# Patient Record
Sex: Male | Born: 1960 | Race: White | Hispanic: No | Marital: Single | Smoking: Never smoker
Health system: Southern US, Community
[De-identification: ages and names within clinical notes are randomized; demographics above are authoritative.]

## PROBLEM LIST (undated history)

## (undated) DIAGNOSIS — E785 Hyperlipidemia, unspecified: Secondary | ICD-10-CM

## (undated) DIAGNOSIS — N201 Calculus of ureter: Secondary | ICD-10-CM

## (undated) DIAGNOSIS — Z87828 Personal history of other (healed) physical injury and trauma: Secondary | ICD-10-CM

## (undated) DIAGNOSIS — M1990 Unspecified osteoarthritis, unspecified site: Secondary | ICD-10-CM

## (undated) DIAGNOSIS — M5030 Other cervical disc degeneration, unspecified cervical region: Secondary | ICD-10-CM

## (undated) DIAGNOSIS — R42 Dizziness and giddiness: Secondary | ICD-10-CM

## (undated) DIAGNOSIS — I10 Essential (primary) hypertension: Secondary | ICD-10-CM

## (undated) DIAGNOSIS — Z9189 Other specified personal risk factors, not elsewhere classified: Secondary | ICD-10-CM

## (undated) DIAGNOSIS — K5730 Diverticulosis of large intestine without perforation or abscess without bleeding: Secondary | ICD-10-CM

## (undated) DIAGNOSIS — Z8739 Personal history of other diseases of the musculoskeletal system and connective tissue: Secondary | ICD-10-CM

## (undated) DIAGNOSIS — H269 Unspecified cataract: Secondary | ICD-10-CM

## (undated) DIAGNOSIS — K219 Gastro-esophageal reflux disease without esophagitis: Secondary | ICD-10-CM

## (undated) DIAGNOSIS — Z973 Presence of spectacles and contact lenses: Secondary | ICD-10-CM

## (undated) DIAGNOSIS — E119 Type 2 diabetes mellitus without complications: Secondary | ICD-10-CM

## (undated) DIAGNOSIS — N200 Calculus of kidney: Secondary | ICD-10-CM

## (undated) HISTORY — PX: POLYPECTOMY: SHX149

## (undated) HISTORY — DX: Dizziness and giddiness: R42

## (undated) HISTORY — DX: Unspecified cataract: H26.9

---

## 1997-08-28 HISTORY — PX: CRANIOTOMY: SHX93

## 2004-08-26 ENCOUNTER — Ambulatory Visit: Payer: Self-pay | Admitting: Internal Medicine

## 2004-10-07 ENCOUNTER — Ambulatory Visit: Payer: Self-pay | Admitting: Family Medicine

## 2006-10-04 ENCOUNTER — Ambulatory Visit: Payer: Self-pay | Admitting: Internal Medicine

## 2006-11-02 ENCOUNTER — Ambulatory Visit: Payer: Self-pay | Admitting: Family Medicine

## 2007-03-11 ENCOUNTER — Ambulatory Visit: Payer: Self-pay | Admitting: Internal Medicine

## 2007-03-11 DIAGNOSIS — Z9189 Other specified personal risk factors, not elsewhere classified: Secondary | ICD-10-CM | POA: Insufficient documentation

## 2008-06-26 ENCOUNTER — Telehealth: Payer: Self-pay | Admitting: Internal Medicine

## 2008-07-13 ENCOUNTER — Telehealth: Payer: Self-pay | Admitting: Family Medicine

## 2008-08-04 ENCOUNTER — Ambulatory Visit: Payer: Self-pay | Admitting: Gastroenterology

## 2008-08-18 ENCOUNTER — Ambulatory Visit: Payer: Self-pay | Admitting: Gastroenterology

## 2008-08-18 HISTORY — PX: COLONOSCOPY W/ POLYPECTOMY: SHX1380

## 2008-08-23 ENCOUNTER — Encounter: Payer: Self-pay | Admitting: Gastroenterology

## 2008-10-16 ENCOUNTER — Telehealth: Payer: Self-pay | Admitting: Family Medicine

## 2008-11-24 ENCOUNTER — Ambulatory Visit: Payer: Self-pay | Admitting: Family Medicine

## 2008-11-24 DIAGNOSIS — M109 Gout, unspecified: Secondary | ICD-10-CM | POA: Insufficient documentation

## 2008-12-18 LAB — CONVERTED CEMR LAB
ALT: 21 units/L (ref 0–53)
AST: 16 units/L (ref 0–37)
Alkaline Phosphatase: 81 units/L (ref 39–117)
CO2: 30 meq/L (ref 19–32)
Calcium: 9.3 mg/dL (ref 8.4–10.5)
Eosinophils Absolute: 0.1 10*3/uL (ref 0.0–0.7)
Eosinophils Relative: 2 % (ref 0.0–5.0)
Glucose, Bld: 122 mg/dL — ABNORMAL HIGH (ref 70–99)
HCT: 42.9 % (ref 39.0–52.0)
Hemoglobin: 14.8 g/dL (ref 13.0–17.0)
LDL Cholesterol: 91 mg/dL (ref 0–99)
Lymphs Abs: 2.1 10*3/uL (ref 0.7–4.0)
MCHC: 34.6 g/dL (ref 30.0–36.0)
Monocytes Relative: 8.6 % (ref 3.0–12.0)
Neutro Abs: 4.3 10*3/uL (ref 1.4–7.7)
Potassium: 4.3 meq/L (ref 3.5–5.1)
RBC: 4.66 M/uL (ref 4.22–5.81)
RDW: 11.9 % (ref 11.5–14.6)
Sodium: 143 meq/L (ref 135–145)
TSH: 0.96 microintl units/mL (ref 0.35–5.50)
Total CHOL/HDL Ratio: 5
Uric Acid, Serum: 7 mg/dL (ref 4.0–7.8)
VLDL: 10 mg/dL (ref 0.0–40.0)

## 2009-09-16 ENCOUNTER — Ambulatory Visit: Payer: Self-pay | Admitting: Family Medicine

## 2009-09-16 DIAGNOSIS — M546 Pain in thoracic spine: Secondary | ICD-10-CM

## 2010-04-11 ENCOUNTER — Telehealth: Payer: Self-pay | Admitting: Family Medicine

## 2010-09-27 NOTE — Progress Notes (Signed)
Summary: REFILL REQUEST  Phone Note Refill Request Message from:  Patient on April 11, 2010 9:04 AM  Refills Requested: Medication #1:  DICLOFENAC SODIUM 50 MG TBEC three times a day as needed pain.   Notes: [facility] on [address].    Initial call taken by: Debbra Riding,  April 11, 2010 9:05 AM    Prescriptions: DICLOFENAC SODIUM 50 MG TBEC (DICLOFENAC SODIUM) three times a day as needed pain  #60 x 5   Entered by:   Raechel Ache, RN   Authorized by:   Nelwyn Salisbury MD   Signed by:   Raechel Ache, RN on 04/11/2010   Method used:   Electronically to        [facility] (retail)       [address]       [place], [place]  [address]       Ph: [phone]       Fax: [phone]   RxID:   [id number]
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
## 2010-09-27 NOTE — Progress Notes (Signed)
Summary: new rx needed  Phone Note Call from Patient Call back at [phone]   Caller: Patient---live call Summary of Call: Need new rx for Indomethacin 50mg  three times a day as needed for gout. call [facility] on [address]. pt stated that he has been on this rx and it has ran out.  Initial call taken by: Warnell Forester,  April 11, 2010 12:16 PM  Follow-up for Phone Call        call in #60 with 5 rf Follow-up by: Nelwyn Salisbury MD,  April 11, 2010 1:12 PM    New/Updated Medications: INDOMETHACIN 50 MG CAPS (INDOMETHACIN) 1 three times a day as needed gout Prescriptions: INDOMETHACIN 50 MG CAPS (INDOMETHACIN) 1 three times a day as needed gout  #60 x 5   Entered by:   Raechel Ache, RN   Authorized by:   Nelwyn Salisbury MD   Signed by:   Raechel Ache, RN on 04/11/2010   Method used:   Electronically to        [facility] (retail)       [address]       [place], [place]  [address]       Ph: [phone]       Fax: [phone]   RxID:   [id number]

## 2010-11-17 ENCOUNTER — Other Ambulatory Visit: Payer: Self-pay | Admitting: Family Medicine

## 2010-11-18 ENCOUNTER — Other Ambulatory Visit: Payer: Self-pay | Admitting: Family Medicine

## 2011-02-15 ENCOUNTER — Telehealth: Payer: Self-pay | Admitting: *Deleted

## 2011-02-15 NOTE — Telephone Encounter (Signed)
Pt needs to be worked in for cpx, had biometrics done at work and was advised A1C was 10.1, needs cpx labs and cpx, next available cpx is 03/29/11.  Please advise if pt can be worked in sooner.

## 2011-02-16 NOTE — Telephone Encounter (Signed)
Actually we need to address these abnormal labs before we do a cpx. A cpx is for health maintenance, not addressing acute problems. Set him up to see me ASAP for a morning appt. Have him come fasting, and have him bring in these results to go over

## 2011-02-17 NOTE — Telephone Encounter (Signed)
Asked pt to return call about abnormal labs and making an appointment.

## 2011-02-21 NOTE — Telephone Encounter (Signed)
Left message to call back  

## 2011-02-24 NOTE — Telephone Encounter (Signed)
Left message on machine for patient  regarding Dr Claris Che note

## 2011-02-24 NOTE — Telephone Encounter (Signed)
Left message to call back  

## 2011-03-09 ENCOUNTER — Encounter: Payer: Self-pay | Admitting: Family Medicine

## 2011-03-15 ENCOUNTER — Ambulatory Visit (INDEPENDENT_AMBULATORY_CARE_PROVIDER_SITE_OTHER): Payer: Managed Care, Other (non HMO) | Admitting: Family Medicine

## 2011-03-15 ENCOUNTER — Encounter: Payer: Self-pay | Admitting: Family Medicine

## 2011-03-15 VITALS — BP 132/86 | HR 108 | Temp 98.6°F | Wt 272.0 lb

## 2011-03-15 DIAGNOSIS — E119 Type 2 diabetes mellitus without complications: Secondary | ICD-10-CM

## 2011-03-15 LAB — BASIC METABOLIC PANEL
BUN: 16 mg/dL (ref 6–23)
Calcium: 9.2 mg/dL (ref 8.4–10.5)
Creatinine, Ser: 0.9 mg/dL (ref 0.4–1.5)
GFR: 94.7 mL/min (ref >60.00)
Glucose, Bld: 275 mg/dL — ABNORMAL HIGH (ref 70–99)
Potassium: 4.3 mEq/L (ref 3.5–5.1)

## 2011-03-15 LAB — CBC WITH DIFFERENTIAL/PLATELET
Basophils Relative: 0.6 % (ref 0.0–3.0)
Eosinophils Relative: 2.3 % (ref 0.0–5.0)
Lymphocytes Relative: 27.2 % (ref 12.0–46.0)
Monocytes Relative: 6 % (ref 3.0–12.0)
Neutrophils Relative %: 63.9 % (ref 43.0–77.0)
RBC: 5.11 Mil/uL (ref 4.22–5.81)
WBC: 8.3 10*3/uL (ref 4.5–10.5)

## 2011-03-15 LAB — POCT URINALYSIS DIPSTICK
Bilirubin, UA: NEGATIVE
Leukocytes, UA: NEGATIVE
Nitrite, UA: NEGATIVE

## 2011-03-15 LAB — HEPATIC FUNCTION PANEL
Alkaline Phosphatase: 90 U/L (ref 39–117)
Total Bilirubin: 1.1 mg/dL (ref 0.3–1.2)
Total Protein: 7.6 g/dL (ref 6.0–8.3)

## 2011-03-15 LAB — MICROALBUMIN / CREATININE URINE RATIO
Creatinine,U: 186.3 mg/dL
Microalb Creat Ratio: 0.7 mg/g (ref 0.0–30.0)
Microalb, Ur: 1.3 mg/dL (ref 0.0–1.9)

## 2011-03-15 NOTE — Progress Notes (Signed)
  Subjective:    Patient ID: Randy Waters, male    DOB: 1960/11/18, 50 y.o.   MRN: [id number]  HPI Here to discuss recent abnormal labs. When we last checked his fasting glucose here in 2010 it was a bit high at 122. However as part of a free health screening at his job on 10-20-10 he had a fasting glucose of 249 and an A1c of 10.3. Since then he has not changed his diet at all, but he is exercising several days a week. He feels fine. He has a strong family hx of diabetes.    Review of Systems  Constitutional: Negative.   Respiratory: Negative.   Cardiovascular: Negative.        Objective:   Physical Exam  Constitutional:       Obese   Cardiovascular: Normal rate, regular rhythm, normal heart sounds and intact distal pulses.   Pulmonary/Chest: Effort normal and breath sounds normal.          Assessment & Plan:  New onset type 2 diabetes. We discussed this at length. He will begin a serious weight reduction plan and limit his carbohydrate intake. He will limit his daily calorie intake to 1800. Get fasting labs today. Depending on these results we may start him on Metformin.

## 2011-03-17 ENCOUNTER — Telehealth: Payer: Self-pay

## 2011-03-17 NOTE — Telephone Encounter (Signed)
Message copied by Beverely Low on Fri Mar 17, 2011  3:22 PM ------      Message from: Gershon Crane A      Created: Thu Mar 16, 2011  1:37 PM       Normal except his diabetes is very poorly controlled. Call in Metformin 1000 mg bid , #60 with 11 rf. See me in one month

## 2011-03-17 NOTE — Telephone Encounter (Signed)
LMTCB

## 2011-03-20 ENCOUNTER — Telehealth: Payer: Self-pay | Admitting: Family Medicine

## 2011-03-20 NOTE — Telephone Encounter (Signed)
I called in script in and pt aware.

## 2011-04-20 ENCOUNTER — Encounter: Payer: Self-pay | Admitting: Family Medicine

## 2011-04-20 ENCOUNTER — Ambulatory Visit (INDEPENDENT_AMBULATORY_CARE_PROVIDER_SITE_OTHER): Payer: Managed Care, Other (non HMO) | Admitting: Family Medicine

## 2011-04-20 VITALS — BP 136/90 | HR 98 | Temp 98.8°F | Wt 264.0 lb

## 2011-04-20 DIAGNOSIS — E1129 Type 2 diabetes mellitus with other diabetic kidney complication: Secondary | ICD-10-CM | POA: Insufficient documentation

## 2011-04-20 DIAGNOSIS — E119 Type 2 diabetes mellitus without complications: Secondary | ICD-10-CM

## 2011-04-20 LAB — GLUCOSE, POCT (MANUAL RESULT ENTRY): POC Glucose: 100

## 2011-04-20 LAB — HEMOGLOBIN A1C: Hgb A1c MFr Bld: 8.6 % — ABNORMAL HIGH (ref 4.6–6.5)

## 2011-04-20 NOTE — Progress Notes (Signed)
  Subjective:    Patient ID: Randy Waters, male    DOB: 24-Oct-1960, 50 y.o.   MRN: [id number]  HPI Here to follow up newly diagnosed type 2 diabetes. He was here one month ago when his A1c was 12.4. We discussed diet and exercise, and he was started on Metformin. He has been doing well, and he has lost about 10 lbs.    Review of Systems  Constitutional: Negative.   Respiratory: Negative.   Cardiovascular: Negative.        Objective:   Physical Exam  Constitutional: He appears well-developed and well-nourished.  Cardiovascular: Normal rate, regular rhythm, normal heart sounds and intact distal pulses.   Pulmonary/Chest: Effort normal and breath sounds normal.          Assessment & Plan:  He seems to be making tremendous strides in getting his diabetes under control. Get another A1c today

## 2011-04-21 ENCOUNTER — Telehealth: Payer: Self-pay | Admitting: Family Medicine

## 2011-04-21 NOTE — Telephone Encounter (Signed)
Spoke with pt and put lab order in computer.

## 2011-04-21 NOTE — Telephone Encounter (Signed)
Message copied by Baldemar Friday on Fri Apr 21, 2011  4:05 PM ------      Message from: Gershon Crane A      Created: Fri Apr 21, 2011  1:10 PM       Diabetes is better but we still have a ways to go. Continue current dose of Metformin and diet. Recheck an A1c in 90 days

## 2011-04-21 NOTE — Telephone Encounter (Signed)
Pt aware of future lab order.

## 2011-07-24 ENCOUNTER — Other Ambulatory Visit (INDEPENDENT_AMBULATORY_CARE_PROVIDER_SITE_OTHER): Payer: Managed Care, Other (non HMO)

## 2011-07-24 DIAGNOSIS — E119 Type 2 diabetes mellitus without complications: Secondary | ICD-10-CM

## 2011-07-27 NOTE — Progress Notes (Signed)
Quick Note:  Spoke with pt ______ 

## 2012-03-21 ENCOUNTER — Other Ambulatory Visit: Payer: Self-pay | Admitting: Family Medicine

## 2012-04-22 ENCOUNTER — Encounter: Payer: Self-pay | Admitting: Family Medicine

## 2012-04-22 ENCOUNTER — Ambulatory Visit (INDEPENDENT_AMBULATORY_CARE_PROVIDER_SITE_OTHER): Payer: Managed Care, Other (non HMO) | Admitting: Family Medicine

## 2012-04-22 VITALS — BP 130/84 | HR 87 | Temp 98.4°F | Wt 226.0 lb

## 2012-04-22 DIAGNOSIS — E119 Type 2 diabetes mellitus without complications: Secondary | ICD-10-CM

## 2012-04-22 DIAGNOSIS — M109 Gout, unspecified: Secondary | ICD-10-CM

## 2012-04-22 DIAGNOSIS — M546 Pain in thoracic spine: Secondary | ICD-10-CM

## 2012-04-22 LAB — BASIC METABOLIC PANEL WITH GFR
BUN: 20 mg/dL (ref 6–23)
CO2: 29 meq/L (ref 19–32)
Calcium: 9.3 mg/dL (ref 8.4–10.5)
Chloride: 106 meq/L (ref 96–112)
Creatinine, Ser: 1 mg/dL (ref 0.4–1.5)
GFR: 84.47 mL/min
Glucose, Bld: 93 mg/dL (ref 70–99)
Potassium: 4.1 meq/L (ref 3.5–5.1)
Sodium: 141 meq/L (ref 135–145)

## 2012-04-22 LAB — HEMOGLOBIN A1C: Hgb A1c MFr Bld: 5.1 % (ref 4.6–6.5)

## 2012-04-22 LAB — MICROALBUMIN / CREATININE URINE RATIO: Microalb, Ur: 1.3 mg/dL (ref 0.0–1.9)

## 2012-04-22 MED ORDER — CYCLOBENZAPRINE HCL 10 MG PO TABS: 10.0000 mg | ORAL_TABLET | Freq: Three times a day (TID) | ORAL | Status: DC | PRN

## 2012-04-22 MED ORDER — INDOMETHACIN 50 MG PO CAPS: 50.0000 mg | ORAL_CAPSULE | Freq: Three times a day (TID) | ORAL | Status: DC | PRN

## 2012-04-22 MED ORDER — METFORMIN HCL 500 MG PO TABS: 500.0000 mg | ORAL_TABLET | Freq: Two times a day (BID) | ORAL | Status: DC

## 2012-04-22 NOTE — Progress Notes (Signed)
  Subjective:    Patient ID: Randy Waters, male    DOB: 02-23-1961, 51 y.o.   MRN: [id number]  HPI Here to follow up. He feels great and he has lost almost 40 lbs in the past year. He is eating a much healthier diet. He thinks his glucoses drop at times because he gets shaky and gets HAs which go away when he eats. He does not check his sugars.    Review of Systems  Constitutional: Negative.   Respiratory: Negative.   Cardiovascular: Negative.        Objective:   Physical Exam  Constitutional: He appears well-developed and well-nourished.  Neck: No thyromegaly present.  Cardiovascular: Normal rate, regular rhythm, normal heart sounds and intact distal pulses.   Pulmonary/Chest: Effort normal and breath sounds normal.  Lymphadenopathy:    He has no cervical adenopathy.          Assessment & Plan:  We will decrease the Metformin to 500 mg bid. Get labs today

## 2012-08-16 ENCOUNTER — Telehealth: Payer: Self-pay | Admitting: Family Medicine

## 2012-08-16 NOTE — Telephone Encounter (Signed)
Patient called stating that he has no more refills on his metformin 500mg  1po bid and need it sent to [facility] on [address]. Please assist.

## 2012-08-19 MED ORDER — METFORMIN HCL 500 MG PO TABS: 500.0000 mg | ORAL_TABLET | Freq: Two times a day (BID) | ORAL | Status: DC

## 2012-09-02 ENCOUNTER — Ambulatory Visit (INDEPENDENT_AMBULATORY_CARE_PROVIDER_SITE_OTHER): Payer: Managed Care, Other (non HMO) | Admitting: Family Medicine

## 2012-09-02 ENCOUNTER — Encounter: Payer: Self-pay | Admitting: Family Medicine

## 2012-09-02 VITALS — BP 144/90 | HR 101 | Temp 98.2°F | Wt 248.0 lb

## 2012-09-02 DIAGNOSIS — E119 Type 2 diabetes mellitus without complications: Secondary | ICD-10-CM

## 2012-09-02 NOTE — Progress Notes (Signed)
  Subjective:    Patient ID: Randy Waters, male    DOB: 1961-06-17, 52 y.o.   MRN: [id number]  HPI Here to follow up on diabetes. He feels great and has no concerns. He ran out of Metformin 2 weeks ago.    Review of Systems  Constitutional: Negative.   Respiratory: Negative.   Cardiovascular: Negative.        Objective:   Physical Exam  Constitutional: He appears well-developed and well-nourished.  Cardiovascular: Normal rate, regular rhythm, normal heart sounds and intact distal pulses.   Pulmonary/Chest: Effort normal and breath sounds normal.          Assessment & Plan:  Get an A1c today. If it looks good we plan to stop the Metformin altogether.

## 2012-09-04 NOTE — Progress Notes (Signed)
Quick Note:  I spoke with pt ______ 

## 2012-12-10 ENCOUNTER — Encounter: Payer: Self-pay | Admitting: Internal Medicine

## 2012-12-10 ENCOUNTER — Ambulatory Visit (INDEPENDENT_AMBULATORY_CARE_PROVIDER_SITE_OTHER): Payer: Managed Care, Other (non HMO) | Admitting: Internal Medicine

## 2012-12-10 ENCOUNTER — Telehealth: Payer: Self-pay | Admitting: Family Medicine

## 2012-12-10 VITALS — BP 136/90 | HR 84 | Temp 98.3°F | Wt 261.0 lb

## 2012-12-10 DIAGNOSIS — M62838 Other muscle spasm: Secondary | ICD-10-CM | POA: Insufficient documentation

## 2012-12-10 DIAGNOSIS — M546 Pain in thoracic spine: Secondary | ICD-10-CM

## 2012-12-10 MED ORDER — METHOCARBAMOL 750 MG PO TABS: 750.0000 mg | ORAL_TABLET | Freq: Four times a day (QID) | ORAL | Status: DC

## 2012-12-10 NOTE — Patient Instructions (Addendum)
Ok to add  muscle relaxant as  needed. Consider   Antiinflammatory .    For pain arthritis.   Try robaxin for   For the muscle  Spasm if needed.

## 2012-12-10 NOTE — Telephone Encounter (Signed)
Patient Information:  Caller Name: Randy Waters  Phone: [phone]  Patient: Randy Waters, Randy Waters  Gender: Male  DOB: 1960/10/28  Age: 52 Years  PCP: Darryll Capers (Adults only)  Office Follow Up:  Does the office need to follow up with this patient?: No  Instructions For The Office: N/A   Symptoms  Reason For Call & Symptoms: Caller relates he has arthritis in neck that has flared up with symptoms similar to previous illness.  Pain radiates in left posterior elbow and in between shoulder blades.  Pain rated at 7 of 10, reduced wth Advil.  Requests muscle relaxer for back pain.  Relates, "This is the worst it has been since I was seen for it."  (Jan 2011).  Emergent symptoms ruled out.  Home care for the interim and parameters for callback per Back Pain protocol.  Caller agreed to be seen.  Reviewed Health History In EMR: Yes  Reviewed Medications In EMR: Yes  Reviewed Allergies In EMR: Yes  Reviewed Surgeries / Procedures: Yes  Date of Onset of Symptoms: 12/06/2012  Treatments Tried: Advil  Treatments Tried Worked: No  Guideline(s) Used:  Back Pain  Disposition Per Guideline:   See Within 2 Weeks in Office  Reason For Disposition Reached:   Back pain persists > 2 weeks  Advice Given:  Cold or Heat:  Cold Pack: For pain or swelling, use a cold pack or ice wrapped in a wet cloth. Put it on the sore area for 20 minutes. Repeat 4 times on the first day, then as needed.  Heat Pack: If pain lasts over 2 days, apply heat to the sore area. Use a heat pack, heating pad, or warm wet washcloth. Do this for 10 minutes, then as needed. For widespread stiffness, take a hot bath or hot shower instead. Move the sore area under the warm water.  Sleep:  Sleep on your side with a pillow between your knees. If you sleep on your back, put a pillow under your knees.  Avoid sleeping on your stomach.  Your mattress should be firm. Avoid waterbeds.  Activity  Keep doing your day-to-day activities if it is  not too painful. Staying active is better than resting.  Avoid anything that makes your pain worse. Avoid heavy lifting, twisting, and too much exercise until your back heals.  You do not need to stay in bed.  Pain Medicines:  For pain relief, take acetaminophen, ibuprofen, or naproxen.  Call Back If:  Numbness or weakness occur  Bowel/bladder problems occur  Pain lasts for more than 2 weeks  You become worse.  Patient Will Follow Care Advice:  YES  Appointment Scheduled:  12/10/2012 15:30:00 Appointment Scheduled Provider:  Berniece Andreas [facility])
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
## 2013-03-19 ENCOUNTER — Telehealth: Payer: Self-pay | Admitting: Family Medicine

## 2013-03-19 NOTE — Telephone Encounter (Signed)
Last seen and filled on 12/10/12 #40 with 1 additional refill. No future follow up. Please advise. Thanks!

## 2013-03-20 ENCOUNTER — Other Ambulatory Visit: Payer: Self-pay | Admitting: Family Medicine

## 2013-03-20 MED ORDER — METHOCARBAMOL 750 MG PO TABS: 750.0000 mg | ORAL_TABLET | Freq: Four times a day (QID) | ORAL | Status: DC

## 2013-03-20 NOTE — Telephone Encounter (Signed)
Refill Robaxin #60 with 5 rf

## 2013-03-21 NOTE — Telephone Encounter (Signed)
done

## 2013-08-11 ENCOUNTER — Encounter: Payer: Self-pay | Admitting: Gastroenterology

## 2014-01-28 ENCOUNTER — Telehealth: Payer: Self-pay | Admitting: Family Medicine

## 2014-01-28 NOTE — Telephone Encounter (Signed)
Pt called to say that his A1C came back at around 10.2 and would like to know if Dr Sarajane Jews can call in  rx of medformin 500 mg  Pharmacy [facility] on [address]

## 2014-01-29 ENCOUNTER — Encounter: Payer: Self-pay | Admitting: Gastroenterology

## 2014-01-29 NOTE — Telephone Encounter (Signed)
I spoke with pt and he is going to discuss this with Dr. Sarajane Jews during his office visit next week.

## 2014-02-03 ENCOUNTER — Ambulatory Visit (INDEPENDENT_AMBULATORY_CARE_PROVIDER_SITE_OTHER): Payer: Managed Care, Other (non HMO) | Admitting: Family Medicine

## 2014-02-03 ENCOUNTER — Encounter: Payer: Self-pay | Admitting: Family Medicine

## 2014-02-03 ENCOUNTER — Telehealth: Payer: Self-pay | Admitting: Family Medicine

## 2014-02-03 VITALS — BP 132/93 | HR 96 | Temp 98.9°F | Ht 71.0 in | Wt 280.0 lb

## 2014-02-03 DIAGNOSIS — E119 Type 2 diabetes mellitus without complications: Secondary | ICD-10-CM

## 2014-02-03 DIAGNOSIS — M109 Gout, unspecified: Secondary | ICD-10-CM

## 2014-02-03 LAB — MICROALBUMIN / CREATININE URINE RATIO
Creatinine,U: 208.4 mg/dL
Microalb Creat Ratio: 1.6 mg/g (ref 0.0–30.0)
Microalb, Ur: 3.3 mg/dL — ABNORMAL HIGH (ref 0.0–1.9)

## 2014-02-03 LAB — BASIC METABOLIC PANEL
BUN: 13 mg/dL (ref 6–23)
CHLORIDE: 102 meq/L (ref 96–112)
CO2: 25 mEq/L (ref 19–32)
Calcium: 9.8 mg/dL (ref 8.4–10.5)
Creatinine, Ser: 0.9 mg/dL (ref 0.4–1.5)
GFR: 94.86 mL/min (ref >60.00)
Glucose, Bld: 174 mg/dL — ABNORMAL HIGH (ref 70–99)
POTASSIUM: 4.2 meq/L (ref 3.5–5.1)
SODIUM: 137 meq/L (ref 135–145)

## 2014-02-03 LAB — CBC WITH DIFFERENTIAL/PLATELET
Basophils Absolute: 0 10*3/uL (ref 0.0–0.1)
Basophils Relative: 0.5 % (ref 0.0–3.0)
EOS ABS: 0.2 10*3/uL (ref 0.0–0.7)
Eosinophils Relative: 2.3 % (ref 0.0–5.0)
HEMATOCRIT: 46.4 % (ref 39.0–52.0)
HEMOGLOBIN: 15.8 g/dL (ref 13.0–17.0)
LYMPHS ABS: 2.4 10*3/uL (ref 0.7–4.0)
Lymphocytes Relative: 28.4 % (ref 12.0–46.0)
MCHC: 34.1 g/dL (ref 30.0–36.0)
MCV: 90 fl (ref 78.0–100.0)
MONO ABS: 0.5 10*3/uL (ref 0.1–1.0)
Monocytes Relative: 6.2 % (ref 3.0–12.0)
Neutro Abs: 5.3 10*3/uL (ref 1.4–7.7)
Neutrophils Relative %: 62.6 % (ref 43.0–77.0)
Platelets: 247 10*3/uL (ref 150.0–400.0)
RBC: 5.15 Mil/uL (ref 4.22–5.81)
RDW: 12.9 % (ref 11.5–15.5)
WBC: 8.4 10*3/uL (ref 4.0–10.5)

## 2014-02-03 LAB — LIPID PANEL
CHOL/HDL RATIO: 6
Cholesterol: 186 mg/dL (ref 0–200)
HDL: 28.7 mg/dL — ABNORMAL LOW (ref >39.00)
LDL CALC: 119 mg/dL — AB (ref 0–99)
NONHDL: 157.3
Triglycerides: 192 mg/dL — ABNORMAL HIGH (ref 0.0–149.0)
VLDL: 38.4 mg/dL (ref 0.0–40.0)

## 2014-02-03 LAB — POCT URINALYSIS DIPSTICK
Bilirubin, UA: NEGATIVE
LEUKOCYTES UA: NEGATIVE
Nitrite, UA: NEGATIVE
Protein, UA: NEGATIVE
SPEC GRAV UA: 1.025
UROBILINOGEN UA: 0.2
pH, UA: 5.5

## 2014-02-03 LAB — HEPATIC FUNCTION PANEL
ALT: 28 U/L (ref 0–53)
AST: 22 U/L (ref 0–37)
Albumin: 4.1 g/dL (ref 3.5–5.2)
Alkaline Phosphatase: 81 U/L (ref 39–117)
BILIRUBIN DIRECT: 0.1 mg/dL (ref 0.0–0.3)
BILIRUBIN TOTAL: 0.9 mg/dL (ref 0.2–1.2)
TOTAL PROTEIN: 6.6 g/dL (ref 6.0–8.3)

## 2014-02-03 LAB — HEMOGLOBIN A1C: HEMOGLOBIN A1C: 10 % — AB (ref 4.6–6.5)

## 2014-02-03 LAB — URIC ACID: Uric Acid, Serum: 7.3 mg/dL (ref 4.0–7.8)

## 2014-02-03 LAB — TSH: TSH: 1.92 u[IU]/mL (ref 0.35–4.50)

## 2014-02-03 MED ORDER — METHOCARBAMOL 750 MG PO TABS: 750.0000 mg | ORAL_TABLET | Freq: Four times a day (QID) | ORAL | Status: DC

## 2014-02-03 MED ORDER — INDOMETHACIN 50 MG PO CAPS: 50.0000 mg | ORAL_CAPSULE | Freq: Three times a day (TID) | ORAL | Status: DC | PRN

## 2014-02-03 MED ORDER — METFORMIN HCL 500 MG PO TABS: 500.0000 mg | ORAL_TABLET | Freq: Two times a day (BID) | ORAL | Status: DC

## 2014-02-03 NOTE — Telephone Encounter (Signed)
Script for Indocin was printed and I did call this in to pharmacy, left on voice mail.

## 2014-02-03 NOTE — Progress Notes (Signed)
   Subjective:    Patient ID: Randy Waters, male    DOB: October 24, 1960, 53 y.o.   MRN: [id number]  HPI Here to follow up on diabetes and gout. He was prescribed allopurinol in the past but never took it. He is now having gout flares once or twice a month. He is watching his diet but has not been taking metformin for a year.    Review of Systems  Constitutional: Negative.   Respiratory: Negative.   Cardiovascular: Negative.        Objective:   Physical Exam  Constitutional: He appears well-developed and well-nourished.  Cardiovascular: Normal rate, regular rhythm, normal heart sounds and intact distal pulses.   Pulmonary/Chest: Effort normal and breath sounds normal.          Assessment & Plan:  Refilled meds. Get labs today including an A1c. Check a uric acid level.

## 2014-02-03 NOTE — Progress Notes (Signed)
Pre visit review using our clinic review tool, if applicable. No additional management support is needed unless otherwise documented below in the visit note. 

## 2014-02-05 MED ORDER — METFORMIN HCL 1000 MG PO TABS: ORAL_TABLET | ORAL | Status: DC

## 2014-02-05 MED ORDER — LISINOPRIL 10 MG PO TABS
10.0000 mg | ORAL_TABLET | Freq: Every day | ORAL | Status: DC
Start: 2014-02-05 — End: 2014-05-26

## 2014-02-05 MED ORDER — GLIPIZIDE 10 MG PO TABS: 10.0000 mg | ORAL_TABLET | Freq: Two times a day (BID) | ORAL | Status: DC

## 2014-02-05 NOTE — Addendum Note (Signed)
Addended by: Aggie Hacker A on: 02/05/2014 11:52 AM   Modules accepted: Orders, Medications

## 2014-02-06 ENCOUNTER — Ambulatory Visit (INDEPENDENT_AMBULATORY_CARE_PROVIDER_SITE_OTHER): Payer: Managed Care, Other (non HMO) | Admitting: Family Medicine

## 2014-02-06 DIAGNOSIS — Z23 Encounter for immunization: Secondary | ICD-10-CM

## 2014-02-06 DIAGNOSIS — Z2911 Encounter for prophylactic immunotherapy for respiratory syncytial virus (RSV): Secondary | ICD-10-CM

## 2014-03-17 ENCOUNTER — Ambulatory Visit: Payer: Managed Care, Other (non HMO) | Admitting: Family Medicine

## 2014-05-19 ENCOUNTER — Other Ambulatory Visit (INDEPENDENT_AMBULATORY_CARE_PROVIDER_SITE_OTHER): Payer: Managed Care, Other (non HMO)

## 2014-05-19 DIAGNOSIS — E119 Type 2 diabetes mellitus without complications: Secondary | ICD-10-CM

## 2014-05-19 LAB — LIPID PANEL
CHOLESTEROL: 170 mg/dL (ref 0–200)
HDL: 27.6 mg/dL — AB (ref >39.00)
LDL Cholesterol: 115 mg/dL — ABNORMAL HIGH (ref 0–99)
NonHDL: 142.4
TRIGLYCERIDES: 139 mg/dL (ref 0.0–149.0)
Total CHOL/HDL Ratio: 6
VLDL: 27.8 mg/dL (ref 0.0–40.0)

## 2014-05-19 LAB — HEMOGLOBIN A1C: Hgb A1c MFr Bld: 7.2 % — ABNORMAL HIGH (ref 4.6–6.5)

## 2014-05-25 ENCOUNTER — Encounter: Payer: Self-pay | Admitting: Gastroenterology

## 2014-05-26 ENCOUNTER — Ambulatory Visit (INDEPENDENT_AMBULATORY_CARE_PROVIDER_SITE_OTHER): Payer: Managed Care, Other (non HMO) | Admitting: Family Medicine

## 2014-05-26 ENCOUNTER — Encounter: Payer: Self-pay | Admitting: Family Medicine

## 2014-05-26 VITALS — BP 135/95 | HR 90 | Temp 98.6°F | Wt 277.0 lb

## 2014-05-26 DIAGNOSIS — E119 Type 2 diabetes mellitus without complications: Secondary | ICD-10-CM

## 2014-05-26 DIAGNOSIS — E785 Hyperlipidemia, unspecified: Secondary | ICD-10-CM

## 2014-05-26 DIAGNOSIS — I10 Essential (primary) hypertension: Secondary | ICD-10-CM | POA: Insufficient documentation

## 2014-05-26 MED ORDER — LISINOPRIL 20 MG PO TABS: 20.0000 mg | ORAL_TABLET | Freq: Every day | ORAL | Status: DC

## 2014-05-26 NOTE — Progress Notes (Signed)
   Subjective:    Patient ID: Randy Waters, male    DOB: 18-Jul-1961, 53 y.o.   MRN: [id number]  HPI Here to follow up. He feels well. His A1c is down to 7.2 but his BP remains high.    Review of Systems  Constitutional: Negative.   Respiratory: Negative.   Cardiovascular: Negative.        Objective:   Physical Exam  Constitutional: He appears well-developed and well-nourished.  Cardiovascular: Normal rate, regular rhythm, normal heart sounds and intact distal pulses.   Pulmonary/Chest: Effort normal and breath sounds normal.          Assessment & Plan:  Increase Lisinopril to 20 mg daily. Recheck in 90 days

## 2014-07-27 ENCOUNTER — Emergency Department (HOSPITAL_BASED_OUTPATIENT_CLINIC_OR_DEPARTMENT_OTHER): Payer: Managed Care, Other (non HMO)

## 2014-07-27 ENCOUNTER — Emergency Department (HOSPITAL_BASED_OUTPATIENT_CLINIC_OR_DEPARTMENT_OTHER)
Admission: EM | Admit: 2014-07-27 | Discharge: 2014-07-27 | Disposition: A | Discharge status: Home / Self Care | Payer: Managed Care, Other (non HMO) | Attending: Emergency Medicine | Admitting: Emergency Medicine

## 2014-07-27 ENCOUNTER — Encounter (HOSPITAL_BASED_OUTPATIENT_CLINIC_OR_DEPARTMENT_OTHER): Payer: Self-pay | Admitting: *Deleted

## 2014-07-27 DIAGNOSIS — Z79899 Other long term (current) drug therapy: Secondary | ICD-10-CM | POA: Insufficient documentation

## 2014-07-27 DIAGNOSIS — M109 Gout, unspecified: Secondary | ICD-10-CM | POA: Insufficient documentation

## 2014-07-27 DIAGNOSIS — N132 Hydronephrosis with renal and ureteral calculous obstruction: Secondary | ICD-10-CM

## 2014-07-27 DIAGNOSIS — N201 Calculus of ureter: Secondary | ICD-10-CM | POA: Insufficient documentation

## 2014-07-27 DIAGNOSIS — R109 Unspecified abdominal pain: Secondary | ICD-10-CM | POA: Diagnosis present

## 2014-07-27 DIAGNOSIS — R7989 Other specified abnormal findings of blood chemistry: Secondary | ICD-10-CM

## 2014-07-27 DIAGNOSIS — N1330 Unspecified hydronephrosis: Secondary | ICD-10-CM | POA: Insufficient documentation

## 2014-07-27 DIAGNOSIS — Z87828 Personal history of other (healed) physical injury and trauma: Secondary | ICD-10-CM | POA: Diagnosis not present

## 2014-07-27 LAB — URINALYSIS, ROUTINE W REFLEX MICROSCOPIC
Bilirubin Urine: NEGATIVE
GLUCOSE, UA: NEGATIVE mg/dL
Hgb urine dipstick: NEGATIVE
Ketones, ur: NEGATIVE mg/dL
LEUKOCYTES UA: NEGATIVE
Nitrite: NEGATIVE
PH: 5 (ref 5.0–8.0)
Protein, ur: NEGATIVE mg/dL
SPECIFIC GRAVITY, URINE: 1.019 (ref 1.005–1.030)
Urobilinogen, UA: 0.2 mg/dL (ref 0.0–1.0)

## 2014-07-27 LAB — COMPREHENSIVE METABOLIC PANEL
ALK PHOS: 72 U/L (ref 39–117)
ALT: 26 U/L (ref 0–53)
AST: 17 U/L (ref 0–37)
Albumin: 4 g/dL (ref 3.5–5.2)
Anion gap: 14 (ref 5–15)
BUN: 17 mg/dL (ref 6–23)
CO2: 23 meq/L (ref 19–32)
Calcium: 9.8 mg/dL (ref 8.4–10.5)
Chloride: 102 mEq/L (ref 96–112)
Creatinine, Ser: 1.7 mg/dL — ABNORMAL HIGH (ref 0.50–1.35)
GFR calc non Af Amer: 44 mL/min — ABNORMAL LOW (ref >90)
GFR, EST AFRICAN AMERICAN: 51 mL/min — AB (ref >90)
Glucose, Bld: 145 mg/dL — ABNORMAL HIGH (ref 70–99)
POTASSIUM: 4.5 meq/L (ref 3.7–5.3)
SODIUM: 139 meq/L (ref 137–147)
TOTAL PROTEIN: 7.4 g/dL (ref 6.0–8.3)
Total Bilirubin: 0.5 mg/dL (ref 0.3–1.2)

## 2014-07-27 LAB — CBC WITH DIFFERENTIAL/PLATELET
Basophils Absolute: 0 10*3/uL (ref 0.0–0.1)
Basophils Relative: 0 % (ref 0–1)
EOS PCT: 2 % (ref 0–5)
Eosinophils Absolute: 0.2 10*3/uL (ref 0.0–0.7)
HCT: 43.8 % (ref 39.0–52.0)
Hemoglobin: 14.7 g/dL (ref 13.0–17.0)
LYMPHS ABS: 2 10*3/uL (ref 0.7–4.0)
LYMPHS PCT: 20 % (ref 12–46)
MCH: 30.7 pg (ref 26.0–34.0)
MCHC: 33.6 g/dL (ref 30.0–36.0)
MCV: 91.4 fL (ref 78.0–100.0)
MONO ABS: 0.7 10*3/uL (ref 0.1–1.0)
Monocytes Relative: 7 % (ref 3–12)
Neutro Abs: 7.1 10*3/uL (ref 1.7–7.7)
Neutrophils Relative %: 71 % (ref 43–77)
PLATELETS: 257 10*3/uL (ref 150–400)
RBC: 4.79 MIL/uL (ref 4.22–5.81)
RDW: 12.4 % (ref 11.5–15.5)
WBC: 10.1 10*3/uL (ref 4.0–10.5)

## 2014-07-27 LAB — LIPASE, BLOOD: Lipase: 32 U/L (ref 11–59)

## 2014-07-27 MED ORDER — ONDANSETRON 4 MG PO TBDP: 4.0000 mg | ORAL_TABLET | Freq: Three times a day (TID) | ORAL | Status: DC | PRN

## 2014-07-27 MED ORDER — HYDROCODONE-ACETAMINOPHEN 5-325 MG PO TABS: 1.0000 | ORAL_TABLET | Freq: Four times a day (QID) | ORAL | Status: DC | PRN

## 2014-07-27 MED ORDER — IOHEXOL 300 MG/ML  SOLN
25.0000 mL | Freq: Once | INTRAMUSCULAR | Status: AC | PRN
  Administered 2014-07-27: 25 mL via ORAL

## 2014-07-27 MED ORDER — IOHEXOL 300 MG/ML  SOLN
80.0000 mL | Freq: Once | INTRAMUSCULAR | Status: AC | PRN
  Administered 2014-07-27: 80 mL via INTRAVENOUS

## 2014-07-27 MED ORDER — SODIUM CHLORIDE 0.9 % IV BOLUS (SEPSIS)
1000.0000 mL | Freq: Once | INTRAVENOUS | Status: AC
  Administered 2014-07-27: 1000 mL via INTRAVENOUS

## 2014-07-27 MED ORDER — TAMSULOSIN HCL 0.4 MG PO CAPS: 0.4000 mg | ORAL_CAPSULE | Freq: Every day | ORAL | Status: DC
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
## 2014-07-27 NOTE — ED Notes (Signed)
Pt reports (R) back pain x 2 weeks.  Increased pain radiating around to his (R) flank and abdomen and vomiting that started this weekend.  Worsening pain with dairy products,.

## 2014-07-27 NOTE — Discharge Instructions (Signed)
Kidney Stones Kidney stones (urolithiasis) are solid masses that form inside your kidneys. The intense pain is caused by the stone moving through the kidney, ureter, bladder, and urethra (urinary tract). When the stone moves, the ureter starts to spasm around the stone. The stone is usually passed in your pee (urine).  HOME CARE  Drink enough fluids to keep your pee clear or pale yellow. This helps to get the stone out.  Strain all pee through the provided strainer. Do not pee without peeing through the strainer, not even once. If you pee the stone out, catch it in the strainer. The stone may be as small as a grain of salt. Take this to your doctor. This will help your doctor figure out what you can do to try to prevent more kidney stones.  Only take medicine as told by your doctor.  Follow up with your doctor as told.  Get follow-up X-rays as told by your doctor. GET HELP IF: You have pain that gets worse even if you have been taking pain medicine. GET HELP RIGHT AWAY IF:   Your pain does not get better with medicine.  You have a fever or shaking chills.  Your pain increases and gets worse over 18 hours.  You have new belly (abdominal) pain.  You feel faint or pass out.  You are unable to pee. MAKE SURE YOU:   Understand these instructions.  Will watch your condition.  Will get help right away if you are not doing well or get worse. Document Released: 01/31/2008 Document Revised: 04/16/2013 Document Reviewed: 01/15/2013 [facility] Patient Information 2015 [place], [place]. This information is not intended to replace advice given to you by your health care provider. Make sure you discuss any questions you have with your health care provider.  Urine Strainer This strainer is used to catch or filter out any stones found in your urine. Place the strainer under your urine stream. Save any stones or objects that you find in your urine. Place them in a plastic or glass container to show  your caregiver. The stones vary in size - some can be very small, so make sure you check the strainer carefully. Your caregiver may send the stone to the lab. When the results are back, your caregiver may recommend medicines or diet changes.  Document Released: 05/19/2004 Document Revised: 11/06/2011 Document Reviewed: 06/26/2008 [facility] Patient Information 2015 [place], [place]. This information is not intended to replace advice given to you by your health care provider. Make sure you discuss any questions you have with your health care provider.

## 2014-07-28 ENCOUNTER — Encounter (HOSPITAL_BASED_OUTPATIENT_CLINIC_OR_DEPARTMENT_OTHER): Payer: Self-pay | Admitting: *Deleted

## 2014-07-28 ENCOUNTER — Other Ambulatory Visit: Payer: Self-pay | Admitting: Urology

## 2014-07-28 NOTE — Progress Notes (Signed)
   07/28/14 1650  OBSTRUCTIVE SLEEP APNEA  Have you ever been diagnosed with sleep apnea through a sleep study? No  Do you snore loudly (loud enough to be heard through closed doors)?  0  Do you often feel tired, fatigued, or sleepy during the daytime? 0  Has anyone observed you stop breathing during your sleep? 0  Do you have, or are you being treated for high blood pressure? 1  BMI more than 35 kg/m2? 1  Age over 54 years old? 1  Neck circumference greater than 40 cm/16 inches? 1  Gender: 1  Obstructive Sleep Apnea Score 5  Score 4 or greater  Results sent to PCP

## 2014-07-28 NOTE — Progress Notes (Signed)
NPO AFTER MN. ARRIVE AT 0945. NEEDS EKG. CURRENT LAB RESULTS IN CHART AND EPIC. IF NEEDED WILL TAKE PAIN / NAUSEA RX AM DOS W/ SIPS OF WATER.

## 2014-07-29 ENCOUNTER — Ambulatory Visit (HOSPITAL_BASED_OUTPATIENT_CLINIC_OR_DEPARTMENT_OTHER): Payer: Managed Care, Other (non HMO) | Admitting: Anesthesiology

## 2014-07-29 ENCOUNTER — Ambulatory Visit (HOSPITAL_BASED_OUTPATIENT_CLINIC_OR_DEPARTMENT_OTHER)
Admission: RE | Admit: 2014-07-29 | Discharge: 2014-07-29 | Disposition: A | Discharge status: Home / Self Care | Payer: Managed Care, Other (non HMO) | Source: Ambulatory Visit | Attending: Urology | Admitting: Urology

## 2014-07-29 ENCOUNTER — Encounter (HOSPITAL_BASED_OUTPATIENT_CLINIC_OR_DEPARTMENT_OTHER)
Admission: RE | Disposition: A | Discharge status: Home / Self Care | Payer: Self-pay | Source: Ambulatory Visit | Attending: Urology

## 2014-07-29 ENCOUNTER — Encounter (HOSPITAL_BASED_OUTPATIENT_CLINIC_OR_DEPARTMENT_OTHER): Payer: Self-pay

## 2014-07-29 DIAGNOSIS — Z6838 Body mass index (BMI) 38.0-38.9, adult: Secondary | ICD-10-CM | POA: Diagnosis not present

## 2014-07-29 DIAGNOSIS — K219 Gastro-esophageal reflux disease without esophagitis: Secondary | ICD-10-CM | POA: Diagnosis not present

## 2014-07-29 DIAGNOSIS — E119 Type 2 diabetes mellitus without complications: Secondary | ICD-10-CM | POA: Diagnosis not present

## 2014-07-29 DIAGNOSIS — M109 Gout, unspecified: Secondary | ICD-10-CM | POA: Diagnosis not present

## 2014-07-29 DIAGNOSIS — N132 Hydronephrosis with renal and ureteral calculous obstruction: Secondary | ICD-10-CM | POA: Insufficient documentation

## 2014-07-29 DIAGNOSIS — E669 Obesity, unspecified: Secondary | ICD-10-CM | POA: Insufficient documentation

## 2014-07-29 DIAGNOSIS — I10 Essential (primary) hypertension: Secondary | ICD-10-CM | POA: Insufficient documentation

## 2014-07-29 DIAGNOSIS — E785 Hyperlipidemia, unspecified: Secondary | ICD-10-CM | POA: Diagnosis not present

## 2014-07-29 DIAGNOSIS — M1990 Unspecified osteoarthritis, unspecified site: Secondary | ICD-10-CM | POA: Insufficient documentation

## 2014-07-29 HISTORY — DX: Essential (primary) hypertension: I10

## 2014-07-29 HISTORY — DX: Personal history of other (healed) physical injury and trauma: Z87.828

## 2014-07-29 HISTORY — PX: CYSTOSCOPY WITH RETROGRADE PYELOGRAM, URETEROSCOPY AND STENT PLACEMENT: SHX5789

## 2014-07-29 HISTORY — PX: HOLMIUM LASER APPLICATION: SHX5852

## 2014-07-29 HISTORY — DX: Diverticulosis of large intestine without perforation or abscess without bleeding: K57.30

## 2014-07-29 HISTORY — DX: Personal history of other diseases of the musculoskeletal system and connective tissue: Z87.39

## 2014-07-29 HISTORY — DX: Presence of spectacles and contact lenses: Z97.3

## 2014-07-29 HISTORY — DX: Other cervical disc degeneration, unspecified cervical region: M50.30

## 2014-07-29 HISTORY — DX: Unspecified osteoarthritis, unspecified site: M19.90

## 2014-07-29 HISTORY — DX: Calculus of kidney: N20.0

## 2014-07-29 HISTORY — DX: Calculus of ureter: N20.1

## 2014-07-29 HISTORY — DX: Gastro-esophageal reflux disease without esophagitis: K21.9

## 2014-07-29 HISTORY — DX: Other specified personal risk factors, not elsewhere classified: Z91.89

## 2014-07-29 HISTORY — DX: Hyperlipidemia, unspecified: E78.5

## 2014-07-29 HISTORY — DX: Type 2 diabetes mellitus without complications: E11.9

## 2014-07-29 LAB — POCT I-STAT 4, (NA,K, GLUC, HGB,HCT)
Glucose, Bld: 185 mg/dL — ABNORMAL HIGH (ref 70–99)
HCT: 44 % (ref 39.0–52.0)
Hemoglobin: 15 g/dL (ref 13.0–17.0)
Potassium: 4.2 mEq/L (ref 3.7–5.3)
Sodium: 139 mEq/L (ref 137–147)

## 2014-07-29 LAB — GLUCOSE, CAPILLARY: Glucose-Capillary: 142 mg/dL — ABNORMAL HIGH (ref 70–99)

## 2014-07-29 SURGERY — CYSTOURETEROSCOPY, WITH RETROGRADE PYELOGRAM AND STENT INSERTION
Anesthesia: General | Site: Ureter | Laterality: Right

## 2014-07-29 MED ORDER — LIDOCAINE HCL (CARDIAC) 20 MG/ML IV SOLN
INTRAVENOUS | Status: DC | PRN
  Administered 2014-07-29: 100 mg via INTRAVENOUS

## 2014-07-29 MED ORDER — ONDANSETRON HCL 4 MG/2ML IJ SOLN
4.0000 mg | Freq: Once | INTRAMUSCULAR | Status: DC | PRN
  Filled 2014-07-29: qty 2

## 2014-07-29 MED ORDER — LACTATED RINGERS IV SOLN
INTRAVENOUS | Status: DC | PRN
  Administered 2014-07-29 (×2): via INTRAVENOUS

## 2014-07-29 MED ORDER — CEFTRIAXONE SODIUM IN DEXTROSE 40 MG/ML IV SOLN
2.0000 g | INTRAVENOUS | Status: AC
  Administered 2014-07-29: 2 g via INTRAVENOUS
  Filled 2014-07-29: qty 50

## 2014-07-29 MED ORDER — MIDAZOLAM HCL 2 MG/2ML IJ SOLN
INTRAMUSCULAR | Status: AC
  Filled 2014-07-29: qty 2

## 2014-07-29 MED ORDER — IOHEXOL 350 MG/ML SOLN
INTRAVENOUS | Status: DC | PRN
  Administered 2014-07-29: 30 mL

## 2014-07-29 MED ORDER — CEFTRIAXONE SODIUM IN DEXTROSE 20 MG/ML IV SOLN
1.0000 g | INTRAVENOUS | Status: DC
  Filled 2014-07-29: qty 50

## 2014-07-29 MED ORDER — FENTANYL CITRATE 0.05 MG/ML IJ SOLN
25.0000 ug | INTRAMUSCULAR | Status: DC | PRN
  Filled 2014-07-29: qty 1

## 2014-07-29 MED ORDER — HYDROMORPHONE HCL 2 MG PO TABS
2.0000 mg | ORAL_TABLET | Freq: Four times a day (QID) | ORAL | Status: DC | PRN
  Administered 2014-07-29: 2 mg via ORAL
  Filled 2014-07-29: qty 1

## 2014-07-29 MED ORDER — CEFTRIAXONE SODIUM 2 G IJ SOLR
INTRAMUSCULAR | Status: AC
  Filled 2014-07-29: qty 2

## 2014-07-29 MED ORDER — FENTANYL CITRATE 0.05 MG/ML IJ SOLN
INTRAMUSCULAR | Status: DC | PRN
  Administered 2014-07-29 (×2): 25 ug via INTRAVENOUS
  Administered 2014-07-29: 100 ug via INTRAVENOUS

## 2014-07-29 MED ORDER — OXYBUTYNIN CHLORIDE 5 MG PO TABS
ORAL_TABLET | ORAL | Status: AC
  Filled 2014-07-29: qty 1

## 2014-07-29 MED ORDER — SENNOSIDES-DOCUSATE SODIUM 8.6-50 MG PO TABS: 1.0000 | ORAL_TABLET | Freq: Two times a day (BID) | ORAL | Status: DC

## 2014-07-29 MED ORDER — MIDAZOLAM HCL 5 MG/5ML IJ SOLN
INTRAMUSCULAR | Status: DC | PRN
  Administered 2014-07-29: 2 mg via INTRAVENOUS

## 2014-07-29 MED ORDER — ESMOLOL BOLUS VIA INFUSION
INTRAVENOUS | Status: DC | PRN
  Administered 2014-07-29: 30 mg via INTRAVENOUS

## 2014-07-29 MED ORDER — OXYBUTYNIN CHLORIDE 5 MG PO TABS: 5.0000 mg | ORAL_TABLET | Freq: Three times a day (TID) | ORAL | Status: DC | PRN

## 2014-07-29 MED ORDER — FENTANYL CITRATE 0.05 MG/ML IJ SOLN
INTRAMUSCULAR | Status: AC
  Filled 2014-07-29: qty 4

## 2014-07-29 MED ORDER — OXYBUTYNIN CHLORIDE 5 MG PO TABS
5.0000 mg | ORAL_TABLET | Freq: Three times a day (TID) | ORAL | Status: DC
  Administered 2014-07-29: 5 mg via ORAL
  Filled 2014-07-29: qty 1

## 2014-07-29 MED ORDER — SULFAMETHOXAZOLE-TRIMETHOPRIM 400-80 MG PO TABS: 1.0000 | ORAL_TABLET | Freq: Two times a day (BID) | ORAL | Status: DC

## 2014-07-29 MED ORDER — LACTATED RINGERS IV SOLN
INTRAVENOUS | Status: DC
  Administered 2014-07-29: 10:00:00 via INTRAVENOUS
  Filled 2014-07-29: qty 1000

## 2014-07-29 MED ORDER — PROPOFOL 10 MG/ML IV BOLUS
INTRAVENOUS | Status: DC | PRN
  Administered 2014-07-29: 300 mg via INTRAVENOUS

## 2014-07-29 MED ORDER — DEXAMETHASONE SODIUM PHOSPHATE 4 MG/ML IJ SOLN
INTRAMUSCULAR | Status: DC | PRN
  Administered 2014-07-29: 10 mg via INTRAVENOUS

## 2014-07-29 MED ORDER — SODIUM CHLORIDE 0.9 % IR SOLN
Status: DC | PRN
  Administered 2014-07-29: 6000 mL

## 2014-07-29 MED ORDER — HYDROMORPHONE HCL 2 MG PO TABS
ORAL_TABLET | ORAL | Status: AC
  Filled 2014-07-29: qty 1

## 2014-07-29 SURGICAL SUPPLY — 44 items
BAG DRAIN URO-CYSTO SKYTR STRL (DRAIN) ×3 IMPLANT
BAG DRN UROCATH (DRAIN) ×1
BASKET LASER NITINOL 1.9FR (BASKET) ×3 IMPLANT
BASKET STNLS GEMINI 4WIRE 3FR (BASKET) IMPLANT
BASKET ZERO TIP NITINOL 2.4FR (BASKET) IMPLANT
BSKT STON RTRVL 120 1.9FR (BASKET) ×1
BSKT STON RTRVL GEM 120X11 3FR (BASKET)
BSKT STON RTRVL ZERO TP 2.4FR (BASKET)
CANISTER SUCT LVC 12 LTR MEDI- (MISCELLANEOUS) ×3 IMPLANT
CATH INTERMIT  6FR 70CM (CATHETERS) ×3 IMPLANT
CATH URET 5FR 28IN CONE TIP (BALLOONS)
CATH URET 5FR 28IN OPEN ENDED (CATHETERS) IMPLANT
CATH URET 5FR 70CM CONE TIP (BALLOONS) IMPLANT
CLOTH BEACON ORANGE TIMEOUT ST (SAFETY) ×3 IMPLANT
DRAPE CAMERA CLOSED 9X96 (DRAPES) ×3 IMPLANT
ELECT REM PT RETURN 9FT ADLT (ELECTROSURGICAL)
ELECTRODE REM PT RTRN 9FT ADLT (ELECTROSURGICAL) IMPLANT
FIBER LASER FLEXIVA 200 (UROLOGICAL SUPPLIES) IMPLANT
FIBER LASER FLEXIVA 365 (UROLOGICAL SUPPLIES) IMPLANT
FIBER LASER TRAC TIP (UROLOGICAL SUPPLIES) ×2 IMPLANT
GLOVE BIO SURGEON STRL SZ7.5 (GLOVE) ×3 IMPLANT
GLOVE BIOGEL M 6.5 STRL (GLOVE) ×2 IMPLANT
GLOVE BIOGEL PI IND STRL 6.5 (GLOVE) IMPLANT
GLOVE BIOGEL PI INDICATOR 6.5 (GLOVE) ×4
GOWN PREVENTION PLUS LG XLONG (DISPOSABLE) ×2 IMPLANT
GOWN STRL REIN XL XLG (GOWN DISPOSABLE) ×1 IMPLANT
GOWN STRL REUS W/TWL LRG LVL3 (GOWN DISPOSABLE) ×2 IMPLANT
GOWN STRL REUS W/TWL XL LVL3 (GOWN DISPOSABLE) ×2 IMPLANT
GUIDEWIRE 0.038 PTFE COATED (WIRE) IMPLANT
GUIDEWIRE ANG ZIPWIRE 038X150 (WIRE) ×3 IMPLANT
GUIDEWIRE STR DUAL SENSOR (WIRE) ×3 IMPLANT
IV NS IRRIG 3000ML ARTHROMATIC (IV SOLUTION) ×6 IMPLANT
KIT BALLIN UROMAX 15FX10 (LABEL) IMPLANT
KIT BALLN UROMAX 15FX4 (MISCELLANEOUS) IMPLANT
KIT BALLN UROMAX 26 75X4 (MISCELLANEOUS)
PACK CYSTO (CUSTOM PROCEDURE TRAY) ×3 IMPLANT
SET HIGH PRES BAL DIL (LABEL)
SHEATH ACCESS URETERAL 38CM (SHEATH) ×2 IMPLANT
SHEATH URET ACCESS 12FR/35CM (UROLOGICAL SUPPLIES) IMPLANT
SHEATH URET ACCESS 12FR/55CM (UROLOGICAL SUPPLIES) IMPLANT
STENT POLARIS 5FRX26 (STENTS) ×2 IMPLANT
SYRINGE 10CC LL (SYRINGE) ×3 IMPLANT
SYRINGE IRR TOOMEY STRL 70CC (SYRINGE) IMPLANT
TUBE FEEDING 8FR 16IN STR KANG (MISCELLANEOUS) IMPLANT

## 2014-07-29 NOTE — Anesthesia Procedure Notes (Signed)
Procedure Name: LMA Insertion Date/Time: 07/29/2014 10:19 AM Performed by: Wanita Chamberlain Pre-anesthesia Checklist: Patient identified, Timeout performed, Emergency Drugs available, Suction available and Patient being monitored Patient Re-evaluated:Patient Re-evaluated prior to inductionOxygen Delivery Method: Circle system utilized Preoxygenation: Pre-oxygenation with 100% oxygen Intubation Type: IV induction Ventilation: Mask ventilation without difficulty LMA: LMA inserted LMA Size: 4.0 Number of attempts: 1 Airway Equipment and Method: Bite block Placement Confirmation: positive ETCO2 Tube secured with: Tape Dental Injury: Teeth and Oropharynx as per pre-operative assessment

## 2014-07-29 NOTE — Transfer of Care (Signed)
Immediate Anesthesia Transfer of Care Note  Patient: Randy Waters  Procedure(s) Performed: Procedure(s): CYSTOSCOPY WITH RETROGRADE PYELOGRAM, URETEROSCOPY AND STENT PLACEMENT (Right) HOLMIUM LASER APPLICATION (Right)  Patient Location: PACU  Anesthesia Type:General  Level of Consciousness: awake, alert , oriented and patient cooperative  Airway & Oxygen Therapy: Patient Spontanous Breathing and Patient connected to nasal cannula oxygen  Post-op Assessment: Report given to PACU RN and Post -op Vital signs reviewed and stable  Post vital signs: Reviewed and stable  Complications: No apparent anesthesia complications

## 2014-07-29 NOTE — Op Note (Signed)
NAME:  YAZEN, ROSKO NO.:  [id number]  MEDICAL RECORD NO.:  [id number]  LOCATION:                                 FACILITY:  PHYSICIAN:  Alexis Frock, MD     DATE OF BIRTH:  [birth year]  DATE OF PROCEDURE:  07/29/2014 DATE OF DISCHARGE:  07/29/2014                              OPERATIVE REPORT   DIAGNOSIS:  Large volume right ureteral and renal stones, right hydronephrosis, refractory renal colic.  PROCEDURE: 1. Cystoscopy with right retrograde pyelogram interpretation. 2. Right ureteroscopy with laser lithotripsy. 3. Insertion of right ureteral stent 5 x 26 Polaris, no tether.  FINDINGS: 1. Moderate hydronephrosis with filling defect at the proximal ureter     consistent with known stone. 2  Large volume estimated 2 cm in total right proximal ureteral and renal stones. 1. Complete removal or ablation of all stone fragments, larger than     1/3rd mm following procedure. 2. Successful placement of right ureteral stent, proximal in renal     pelvis and distal in urinary bladder.  ESTIMATED BLOOD LOSS:  Nil.  COMPLICATIONS:  None.  SPECIMENS:  Right renal ureteral stone fragment for compositional analysis.  INDICATION:  Mr. Degidio is a pleasant 53 year old gentleman who on workup of severe right flank pain was found to have a large bilobed shaped proximal ureteral stone as 14 mm in long axis.  There was also additional renal stones in both sides that were not obstructing. Options were discussed with the patient including medical therapy versus percutaneous approach versus shockwave lithotripsy versus ureteroscopy and he wished to proceed with ureteroscopic stone manipulation with goal of stone free on the right side with observation on the left.  Informed consent was obtained and placed in medical record.  PROCEDURE IN DETAIL:  The patient being Randy Waters and procedure being right ureteroscopic stone manipulation was confirmed. Procedure  was carried out.  Time-out was performed.  Intravenous antibiotics administered.  LMA anesthesia was introduced.  The patient was placed into a low lithotomy position.  Sterile field was created by prepping and draping the patient's penis, perineum, proximal thighs using iodine x3.  Next, cystourethroscopy was performed using a 22- French rigid cystoscope with 12-degree offset lens.  Inspection of the anterior-posterior unremarkable.  Inspection of urinary bladder revealed no diverticula, calcifications, or papular lesions.  The right ureteral orifice was cannulated with a 6-French end-hole catheter and right retrograde pyelogram was obtained.  Right retrograde pyelogram demonstrated single right ureter, single system right kidney.  There was moderate hydronephrosis without ureteral nephrosis and filling defect of the UPJ consistent with known stone.  A 0.038 Glidewire was advanced at the level of the upper pole and set aside as a safety wire.  Next, semi-rigid ureteroscopy was performed the distal two-thirds of the right ureter alongside a separate Sensor working wire.  No mucosal abnormalities or calcifications were noted. An 8-French feeding tube was placed in urinary bladder for pressure release and the semi-rigid ureteroscope was exchanged for a 12/14, 38-cm ureteral access sheath under fluoroscopic vision to the level of the proximal ureter.  Next, flexible digital ureteroscopy was performed of the proximal right ureter and  systematic inspection of the right kidney and using 8-French digital ureteroscope.  As such, the large bilobed stone was seen in the UPJ, this was repositioned into a midpole calyx. Additional calcifications were noted in the upper pole as expected. These all appeared to be much too large for simple basketing.  As such, holmium laser energy was applied to the stone using multiple settings including mostly a dusting setting of 0.2 joules and 30 hertz,  ablating approximately 50% of the stone volume using dusting technique and then creating small stone fragments 2-3 mm which were then sequentially basketed and removed in their entirety and set aside for compositional analysis following these maneuvers with complete resolution of all stone fragments larger than 1/3rd mm. There was excellent hemostasis.  There was no evidence of renal perforation. Total stone ablation time approximately 90 minutes.  Given the prolonged procedure and large volume stone, it was felt that ureteral stenting was necessary to avoid significant ureteral edema, also since the patient the patient has known acute renal insufficiency likely broken stone. The sheath was removed under continuous uteroscopic vision, no mucosal abnormalities were found.  Finally, a new 5 x 26 Polaris stent was placed with pinning safety wire using cystoscopic and fluoroscopic guidance.  Good proximal and distal deployment were noted.  Bladder was emptied per cystoscope. Procedure was then terminated.  The patient tolerated the procedure well with no immediate periprocedural complications.  The patient was taken to postanesthesia care unit in stable condition.          ______________________________ Alexis Frock, MD     TM/MEDQ  D:  07/29/2014  T:  07/29/2014  Job:  [id number]
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
## 2014-07-29 NOTE — Op Note (Deleted)
NAME:  Randy Waters, Randy Waters NO.:  [id number]  MEDICAL RECORD NO.:  [id number]  LOCATION:                                 FACILITY:  PHYSICIAN:  Alexis Frock, MD     DATE OF BIRTH:  08/14/1961  DATE OF PROCEDURE:  07/29/2014 DATE OF DISCHARGE:  07/29/2014                              OPERATIVE REPORT   DIAGNOSIS:  Large volume right ureteral and renal stones, right hydronephrosis, refractory renal colic.  PROCEDURE: 1. Cystoscopy with right retrograde pyelogram interpretation. 2. Right ureteroscopy with laser lithotripsy. 3. Insertion of right ureteral stent 5 x 26 Polaris, no tether.  FINDINGS: 1. Moderate hydronephrosis with filling defect at the proximal ureter     consistent with known stone. 2  Large volume estimated 2 cm in total right proximal ureteral and renal stones. 1. Complete removal or ablation of all stone fragments, larger than     1/3rd mm following procedure. 2. Successful placement of right ureteral stent, proximal in renal     pelvis and distal in urinary bladder.  ESTIMATED BLOOD LOSS:  Nil.  COMPLICATIONS:  None.  SPECIMENS:  Right renal ureteral stone fragment for compositional analysis.  INDICATION:  Randy Waters is a pleasant 53 year old gentleman who on workup of severe right flank pain was found to have a large bilobed shaped proximal ureteral stone as 14 mm in long axis.  There was also additional renal stones in both sides that were not obstructing. Options were discussed with the patient including medical therapy versus percutaneous approach versus shockwave lithotripsy versus ureteroscopy and he wished to proceed with ureteroscopic stone manipulation with goal of stone free on the right side with observation on the left.  Informed consent was obtained and placed in medical record.  PROCEDURE IN DETAIL:  The patient being Randy Waters and procedure being right ureteroscopic stone manipulation was confirmed. Procedure  was carried out.  Time-out was performed.  Intravenous antibiotics administered.  LMA anesthesia was introduced.  The patient was placed into a low lithotomy position.  Sterile field was created by prepping and draping the patient's penis, perineum, proximal thighs using iodine x3.  Next, cystourethroscopy was performed using a 22- French rigid cystoscope with 12-degree offset lens.  Inspection of the anterior-posterior unremarkable.  Inspection of urinary bladder revealed no diverticula, calcifications, or papular lesions.  The right ureteral orifice was cannulated with a 6-French end-hole catheter and right retrograde pyelogram was obtained.  Right retrograde pyelogram demonstrated single right ureter, single system right kidney.  There was moderate hydronephrosis without ureteral nephrosis and filling defect of the UPJ consistent with known stone.  A 0.038 Glidewire was advanced at the level of the upper pole and set aside as a safety wire.  Next, semi-rigid ureteroscopy was performed the distal two-thirds of the right ureter alongside a separate Sensor working wire.  No mucosal abnormalities or calcifications were noted. An 8-French feeding tube was placed in urinary bladder for pressure release and the semi-rigid ureteroscope was exchanged for a 12/14, 38-cm ureteral access sheath under fluoroscopic vision to the level of the proximal ureter.  Next, flexible digital ureteroscopy was performed of the proximal right ureter and  systematic inspection of the right kidney and using 8-French digital ureteroscope.  As such, the large bilobed stone was seen in the UPJ, this was repositioned into a midpole calyx. Additional calcifications were noted in the upper pole as expected. These all appeared to be much too large for simple basketing.  As such, holmium laser energy was applied to the stone using multiple settings including mostly a dusting setting of 0.2 joules and 30 hertz,  ablating approximately 50% of the stone volume using dusting technique and then creating small stone fragments 2-3 mm which were then sequentially basketed and removed in their entirety and set aside for compositional analysis following these maneuvers with complete resolution of all stone fragments larger than 1/3rd mm. There was excellent hemostasis.  There was no evidence of renal perforation. Total stone ablation time approximately 90 minutes.  Given the prolonged procedure and large volume stone, it was felt that ureteral stenting was necessary to avoid significant ureteral edema, also since the patient the patient has known acute renal insufficiency likely broken stone. The sheath was removed under continuous uteroscopic vision, no mucosal abnormalities were found.  Finally, a new 5 x 26 Polaris stent was placed with pinning safety wire using cystoscopic and fluoroscopic guidance.  Good proximal and distal deployment were noted.  Bladder was emptied per cystoscope. Procedure was then terminated.  The patient tolerated the procedure well with no immediate periprocedural complications.  The patient was taken to postanesthesia care unit in stable condition.          ______________________________ Alexis Frock, MD     TM/MEDQ  D:  07/29/2014  T:  07/29/2014  Job:  [id number]
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
## 2014-07-29 NOTE — Brief Op Note (Signed)
07/29/2014  12:00 PM  PATIENT:  Randy Waters  53 y.o. male  PRE-OPERATIVE DIAGNOSIS:  RIGHT URETERAL AND RENAL STONES  POST-OPERATIVE DIAGNOSIS:  RIGHT URETERAL AND RENAL STONES  PROCEDURE:  Procedure(s): CYSTOSCOPY WITH RETROGRADE PYELOGRAM, URETEROSCOPY AND STENT PLACEMENT (Right) HOLMIUM LASER APPLICATION (Right)  SURGEON:  Surgeon(s) and Role:    * Alexis Frock, MD - Primary  PHYSICIAN ASSISTANT:   ASSISTANTS: none   ANESTHESIA:   general  EBL:  Total I/O In: 1200 [I.V.:1200] Out: -   BLOOD ADMINISTERED:none  DRAINS: none   LOCAL MEDICATIONS USED:  NONE  SPECIMEN:  Source of Specimen:  Rt renal and ureteral stone fragments  DISPOSITION OF SPECIMEN:  [facility] for compositional analysis  COUNTS:  YES  TOURNIQUET:  * No tourniquets in log *  DICTATION: .Other Dictation: Dictation Number [id number]  PLAN OF CARE: Discharge to home after PACU  PATIENT DISPOSITION:  PACU - hemodynamically stable.   Delay start of Pharmacological VTE agent (>24hrs) due to surgical blood loss or risk of bleeding: yes

## 2014-07-29 NOTE — Discharge Instructions (Signed)
1 - You may have urinary urgency (bladder spasms) and bloody urine on / off with stent in place. This is normal. ° °2 - Call MD or go to ER for fever >102, severe pain / nausea / vomiting not relieved by medications, or acute change in medical status °[facility] °[phone] °Post Ureteroscopy With or Without Stent Instructions ° °Definitions: ° °Ureter: The duct that transports urine from the kidney to the bladder. °Stent:   A plastic hollow tube that is placed into the ureter, from the kidney to the                 bladder to prevent the ureter from swelling shut. ° °GENERAL INSTRUCTIONS: ° °Despite the fact that no skin incisions were used, the area around the ureter and bladder is raw and irritated. The stent is a foreign body which will further irritate the bladder wall. This irritation is manifested by increased frequency of urination, both day and night, and by an increase in the urge to urinate. In some, the urge to urinate is present almost always. Sometimes the urge is strong enough that you may not be able to stop yourself from urinating. The only real cure is to remove the stent and then give time for the bladder wall to heal which can't be done until the danger of the ureter swelling shut has passed, which varies. ° °You may see some blood in your urine while the stent is in place and a few days afterwards. Do not be alarmed, even if the urine was clear for a while. Get off your feet and drink lots of fluids until clearing occurs. If you start to pass clots or don't improve, call us. ° °DIET: °You may return to your normal diet immediately. Because of the raw surface of your bladder, alcohol, spicy foods, acid type foods and drinks with caffeine may cause irritation or frequency and should be used in moderation. To keep your urine flowing freely and to avoid constipation, drink plenty of fluids during the day ( 8-10 glasses ). °Tip: Avoid cranberry juice because it is very  acidic. ° °ACTIVITY: °Your physical activity doesn't need to be restricted. However, if you are very active, you may see some blood in your urine. We suggest that you reduce your activity under these circumstances until the bleeding has stopped. ° °BOWELS: °It is important to keep your bowels regular during the postoperative period. Straining with bowel movements can cause bleeding. A bowel movement every other day is reasonable. Use a mild laxative if needed, such as Milk of Magnesia 2-3 tablespoons, or 2 Dulcolax tablets. Call if you continue to have problems. If you have been taking narcotics for pain, before, during or after your surgery, you may be constipated. Take a laxative if necessary. ° ° °MEDICATION: °You should resume your pre-surgery medications unless told not to. In addition you will often be given an antibiotic to prevent infection. These should be taken as prescribed until the bottles are finished unless you are having an unusual reaction to one of the drugs. ° °PROBLEMS YOU SHOULD REPORT TO US: °· Fevers over 100.5 Fahrenheit. °· Heavy bleeding, or clots ( See above notes about blood in urine ). °· Inability to urinate. °· Drug reactions ( hives, rash, nausea, vomiting, diarrhea ). °· Severe burning or pain with urination that is not improving. ° °FOLLOW-UP: °You will need a follow-up appointment to monitor your progress. Call for this appointment at the number listed above.   Usually the first appointment will be about three to fourteen days after your surgery.      Post Anesthesia Home Care Instructions  Activity: Get plenty of rest for the remainder of the day. A responsible adult should stay with you for 24 hours following the procedure.  For the next 24 hours, DO NOT: -Drive a car -Paediatric nurse -Drink alcoholic beverages -Take any medication unless instructed by your physician -Make any legal decisions or sign important papers.  Meals: Start with liquid foods such as  gelatin or soup. Progress to regular foods as tolerated. Avoid greasy, spicy, heavy foods. If nausea and/or vomiting occur, drink only clear liquids until the nausea and/or vomiting subsides. Call your physician if vomiting continues.  Special Instructions/Symptoms: Your throat may feel dry or sore from the anesthesia or the breathing tube placed in your throat during surgery. If this causes discomfort, gargle with warm salt water. The discomfort should disappear within 24 hours.

## 2014-07-29 NOTE — Anesthesia Postprocedure Evaluation (Signed)
  Anesthesia Post-op Note  Patient: Randy Waters  Procedure(s) Performed: Procedure(s) (LRB): CYSTOSCOPY WITH RETROGRADE PYELOGRAM, URETEROSCOPY AND STENT PLACEMENT (Right) HOLMIUM LASER APPLICATION (Right)  Patient Location: PACU  Anesthesia Type: General  Level of Consciousness: awake and alert   Airway and Oxygen Therapy: Patient Spontanous Breathing  Post-op Pain: mild  Post-op Assessment: Post-op Vital signs reviewed, Patient's Cardiovascular Status Stable, Respiratory Function Stable, Patent Airway and No signs of Nausea or vomiting  Last Vitals:  Filed Vitals:   07/29/14 1214  BP: 140/95  Pulse: 95  Temp: 36.6 C  Resp: 14    Post-op Vital Signs: stable   Complications: No apparent anesthesia complications

## 2014-07-30 ENCOUNTER — Encounter (HOSPITAL_BASED_OUTPATIENT_CLINIC_OR_DEPARTMENT_OTHER): Payer: Self-pay | Admitting: Urology

## 2014-09-01 ENCOUNTER — Ambulatory Visit (INDEPENDENT_AMBULATORY_CARE_PROVIDER_SITE_OTHER): Payer: Managed Care, Other (non HMO) | Admitting: Family Medicine

## 2014-09-01 ENCOUNTER — Encounter: Payer: Self-pay | Admitting: Family Medicine

## 2014-09-01 VITALS — BP 124/90 | HR 96 | Temp 98.2°F | Ht 70.0 in | Wt 272.0 lb

## 2014-09-01 DIAGNOSIS — I10 Essential (primary) hypertension: Secondary | ICD-10-CM

## 2014-09-01 DIAGNOSIS — E119 Type 2 diabetes mellitus without complications: Secondary | ICD-10-CM

## 2014-09-01 LAB — HEMOGLOBIN A1C: HEMOGLOBIN A1C: 8.4 % — AB (ref 4.6–6.5)

## 2014-09-01 NOTE — Progress Notes (Signed)
Pre visit review using our clinic review tool, if applicable. No additional management support is needed unless otherwise documented below in the visit note. 

## 2014-09-01 NOTE — Progress Notes (Signed)
   Subjective:    Patient ID: Randy Waters, male    DOB: 07/12/1961, 54 y.o.   MRN: [id number]  HPI Here to follow up. He had an eventful December which included cystoscopy to retrieve a large kidney stone. This was successful and he now feels fine. His BP has been stable but he has not been checking his glucoses.    Review of Systems  Constitutional: Negative.   Respiratory: Negative.   Cardiovascular: Negative.        Objective:   Physical Exam  Constitutional: He appears well-developed and well-nourished.  Cardiovascular: Normal rate, regular rhythm, normal heart sounds and intact distal pulses.   Pulmonary/Chest: Effort normal and breath sounds normal.          Assessment & Plan:  He seems to be doing well. His BP is now under control. Check an A1c today

## 2014-09-02 ENCOUNTER — Telehealth: Payer: Self-pay | Admitting: Family Medicine

## 2014-09-02 NOTE — Telephone Encounter (Signed)
emmi mailed  °

## 2014-09-14 MED ORDER — GLIPIZIDE 10 MG PO TABS: 10.0000 mg | ORAL_TABLET | Freq: Two times a day (BID) | ORAL | Status: DC

## 2014-09-14 NOTE — Addendum Note (Signed)
Addended by: Aggie Hacker A on: 09/14/2014 10:10 AM   Modules accepted: Orders, Medications

## 2014-11-19 ENCOUNTER — Emergency Department
Admission: EM | Admit: 2014-11-19 | Discharge: 2014-11-19 | Disposition: A | Discharge status: Home / Self Care | Payer: Managed Care, Other (non HMO) | Source: Home / Self Care | Attending: Emergency Medicine | Admitting: Emergency Medicine

## 2014-11-19 ENCOUNTER — Encounter: Payer: Self-pay | Admitting: *Deleted

## 2014-11-19 DIAGNOSIS — J111 Influenza due to unidentified influenza virus with other respiratory manifestations: Secondary | ICD-10-CM | POA: Diagnosis not present

## 2014-11-19 LAB — POCT INFLUENZA A/B
INFLUENZA B, POC: NEGATIVE
Influenza A, POC: POSITIVE

## 2014-11-19 MED ORDER — OSELTAMIVIR PHOSPHATE 75 MG PO CAPS: 75.0000 mg | ORAL_CAPSULE | Freq: Two times a day (BID) | ORAL | Status: DC

## 2014-11-19 MED ORDER — BENZONATATE 100 MG PO CAPS: 100.0000 mg | ORAL_CAPSULE | Freq: Three times a day (TID) | ORAL | Status: DC

## 2014-11-19 NOTE — ED Notes (Signed)
Pt c/o dry cough without fever x 2 days.

## 2014-11-19 NOTE — Discharge Instructions (Signed)
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
## 2015-01-29 ENCOUNTER — Ambulatory Visit (INDEPENDENT_AMBULATORY_CARE_PROVIDER_SITE_OTHER): Payer: Managed Care, Other (non HMO) | Admitting: Family Medicine

## 2015-01-29 ENCOUNTER — Encounter: Payer: Self-pay | Admitting: Family Medicine

## 2015-01-29 VITALS — BP 129/76 | HR 101 | Temp 99.0°F | Ht 70.0 in | Wt 277.0 lb

## 2015-01-29 DIAGNOSIS — I10 Essential (primary) hypertension: Secondary | ICD-10-CM | POA: Diagnosis not present

## 2015-01-29 DIAGNOSIS — M1000 Idiopathic gout, unspecified site: Secondary | ICD-10-CM | POA: Diagnosis not present

## 2015-01-29 DIAGNOSIS — E119 Type 2 diabetes mellitus without complications: Secondary | ICD-10-CM | POA: Diagnosis not present

## 2015-01-29 LAB — BASIC METABOLIC PANEL
BUN: 19 mg/dL (ref 6–23)
CALCIUM: 9.6 mg/dL (ref 8.4–10.5)
CO2: 27 mEq/L (ref 19–32)
CREATININE: 1.12 mg/dL (ref 0.40–1.50)
Chloride: 103 mEq/L (ref 96–112)
GFR: 72.49 mL/min (ref >60.00)
Glucose, Bld: 130 mg/dL — ABNORMAL HIGH (ref 70–99)
Potassium: 4.3 mEq/L (ref 3.5–5.1)
Sodium: 136 mEq/L (ref 135–145)

## 2015-01-29 LAB — HEMOGLOBIN A1C: Hgb A1c MFr Bld: 6.5 % (ref 4.6–6.5)

## 2015-01-29 MED ORDER — POTASSIUM CITRATE ER 10 MEQ (1080 MG) PO TBCR: 10.0000 meq | EXTENDED_RELEASE_TABLET | Freq: Two times a day (BID) | ORAL | Status: DC

## 2015-01-29 MED ORDER — METHOCARBAMOL 750 MG PO TABS: 750.0000 mg | ORAL_TABLET | Freq: Four times a day (QID) | ORAL | Status: DC

## 2015-01-29 NOTE — Progress Notes (Signed)
Pre visit review using our clinic review tool, if applicable. No additional management support is needed unless otherwise documented below in the visit note. 

## 2015-01-29 NOTE — Progress Notes (Signed)
   Subjective:    Patient ID: Randy Waters, male    DOB: 10/24/1960, 54 y.o.   MRN: [id number]  HPI Here to follow up. His BP is stable and he has felt well. He has made a lot of changes with his diet and he has been gettting frequent drops in his glucose. He feels shaky and then feels better when he eats something. He has had no further kidney stones.    Review of Systems  Constitutional: Negative.   Respiratory: Negative.   Cardiovascular: Negative.   Genitourinary: Negative.        Objective:   Physical Exam  Constitutional: He appears well-developed and well-nourished.  Neck: No thyromegaly present.  Cardiovascular: Normal rate, regular rhythm, normal heart sounds and intact distal pulses.   Pulmonary/Chest: Effort normal and breath sounds normal.  Lymphadenopathy:    He has no cervical adenopathy.          Assessment & Plan:  His HTN is stable. As for the diabetes, we will stop the Glipizide. Get an A1c today along with a BMET.

## 2015-03-08 ENCOUNTER — Other Ambulatory Visit: Payer: Self-pay | Admitting: Family Medicine

## 2015-06-09 ENCOUNTER — Other Ambulatory Visit: Payer: Self-pay | Admitting: Family Medicine

## 2015-06-09 ENCOUNTER — Telehealth: Payer: Self-pay | Admitting: Family Medicine

## 2015-06-09 NOTE — Telephone Encounter (Signed)
Pt request refill of the following: lisinopril (PRINIVIL,ZESTRIL) 20 MG tablet , indomethacin (INDOCIN) 50 MG capsule   Phamacy: [facility]

## 2015-06-10 NOTE — Telephone Encounter (Signed)
I sent scripts e-scribe. 

## 2015-09-21 LAB — HM DIABETES EYE EXAM

## 2015-10-18 ENCOUNTER — Other Ambulatory Visit: Payer: Self-pay | Admitting: Family Medicine

## 2015-10-22 ENCOUNTER — Emergency Department
Admission: EM | Admit: 2015-10-22 | Discharge: 2015-10-22 | Disposition: A | Discharge status: Home / Self Care | Payer: Managed Care, Other (non HMO) | Source: Home / Self Care | Attending: Family Medicine | Admitting: Family Medicine

## 2015-10-22 ENCOUNTER — Encounter: Payer: Self-pay | Admitting: Emergency Medicine

## 2015-10-22 DIAGNOSIS — Z658 Other specified problems related to psychosocial circumstances: Secondary | ICD-10-CM | POA: Diagnosis not present

## 2015-10-22 DIAGNOSIS — R000 Tachycardia, unspecified: Secondary | ICD-10-CM | POA: Diagnosis not present

## 2015-10-22 DIAGNOSIS — J029 Acute pharyngitis, unspecified: Secondary | ICD-10-CM

## 2015-10-22 DIAGNOSIS — F439 Reaction to severe stress, unspecified: Secondary | ICD-10-CM

## 2015-10-22 LAB — POCT RAPID STREP A (OFFICE): Rapid Strep A Screen: NEGATIVE
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
## 2015-10-22 NOTE — Discharge Instructions (Signed)
You may take 400-600mg  Ibuprofen (Motrin) every 6-8 hours for fever and pain  Alternate with Tylenol  You may take 500mg  Tylenol every 4-6 hours as needed for fever and pain  Follow-up with your primary care provider next week for recheck of symptoms if not improving.  Be sure to drink plenty of fluids and rest, at least 8hrs of sleep a night, preferably more while you are sick. Return urgent care or go to closest ER if you cannot keep down fluids/signs of dehydration, fever not reducing with Tylenol, difficulty breathing/wheezing, stiff neck, worsening condition, or other concerns (see below)    Pharyngitis Pharyngitis is redness, pain, and swelling (inflammation) of your pharynx.  CAUSES  Pharyngitis is usually caused by infection. Most of the time, these infections are from viruses (viral) and are part of a cold. However, sometimes pharyngitis is caused by bacteria (bacterial). Pharyngitis can also be caused by allergies. Viral pharyngitis may be spread from person to person by coughing, sneezing, and personal items or utensils (cups, forks, spoons, toothbrushes). Bacterial pharyngitis may be spread from person to person by more intimate contact, such as kissing.  SIGNS AND SYMPTOMS  Symptoms of pharyngitis include:   Sore throat.   Tiredness (fatigue).   Low-grade fever.   Headache.  Joint pain and muscle aches.  Skin rashes.  Swollen lymph nodes.  Plaque-like film on throat or tonsils (often seen with bacterial pharyngitis). DIAGNOSIS  Your health care provider will ask you questions about your illness and your symptoms. Your medical history, along with a physical exam, is often all that is needed to diagnose pharyngitis. Sometimes, a rapid strep test is done. Other lab tests may also be done, depending on the suspected cause.  TREATMENT  Viral pharyngitis will usually get better in 3-4 days without the use of medicine. Bacterial pharyngitis is treated with medicines that  kill germs (antibiotics).  HOME CARE INSTRUCTIONS   Drink enough water and fluids to keep your urine clear or pale yellow.   Only take over-the-counter or prescription medicines as directed by your health care provider:   If you are prescribed antibiotics, make sure you finish them even if you start to feel better.   Do not take aspirin.   Get lots of rest.   Gargle with 8 oz of salt water ( tsp of salt per 1 qt of water) as often as every 1-2 hours to soothe your throat.   Throat lozenges (if you are not at risk for choking) or sprays may be used to soothe your throat. SEEK MEDICAL CARE IF:   You have large, tender lumps in your neck.  You have a rash.  You cough up green, yellow-brown, or bloody spit. SEEK IMMEDIATE MEDICAL CARE IF:   Your neck becomes stiff.  You drool or are unable to swallow liquids.  You vomit or are unable to keep medicines or liquids down.  You have severe pain that does not go away with the use of recommended medicines.  You have trouble breathing (not caused by a stuffy nose). MAKE SURE YOU:   Understand these instructions.  Will watch your condition.  Will get help right away if you are not doing well or get worse.   This information is not intended to replace advice given to you by your health care provider. Make sure you discuss any questions you have with your health care provider.   Document Released: 08/14/2005 Document Revised: 06/04/2013 Document Reviewed: 04/21/2013 Elsevier Interactive Patient Education 2016 Elsevier  Inc. ° °

## 2015-10-22 NOTE — ED Notes (Signed)
Pt c/o sore throat that started suddenly yesterday. States he feels like his throat is swollen and something is stuck. Afebrile.

## 2015-10-23 LAB — STREP A DNA PROBE: GASP: NOT DETECTED

## 2015-10-26 ENCOUNTER — Telehealth: Payer: Self-pay | Admitting: *Deleted

## 2016-01-25 ENCOUNTER — Encounter: Payer: Self-pay | Admitting: Family Medicine

## 2016-01-25 ENCOUNTER — Other Ambulatory Visit: Payer: Self-pay | Admitting: Family Medicine

## 2016-01-25 ENCOUNTER — Ambulatory Visit (INDEPENDENT_AMBULATORY_CARE_PROVIDER_SITE_OTHER): Payer: Managed Care, Other (non HMO) | Admitting: Family Medicine

## 2016-01-25 VITALS — BP 148/97 | HR 125 | Temp 99.1°F | Ht 70.0 in | Wt 274.0 lb

## 2016-01-25 DIAGNOSIS — E119 Type 2 diabetes mellitus without complications: Secondary | ICD-10-CM | POA: Diagnosis not present

## 2016-01-25 DIAGNOSIS — M1000 Idiopathic gout, unspecified site: Secondary | ICD-10-CM | POA: Diagnosis not present

## 2016-01-25 LAB — URIC ACID: Uric Acid, Serum: 7.7 mg/dL (ref 4.0–7.8)

## 2016-01-25 LAB — HEMOGLOBIN A1C: Hgb A1c MFr Bld: 7.7 % — ABNORMAL HIGH (ref 4.6–6.5)

## 2016-01-25 MED ORDER — METHYLPREDNISOLONE 4 MG PO TBPK: ORAL_TABLET | ORAL | Status: DC

## 2016-01-25 NOTE — Progress Notes (Signed)
   Subjective:    Patient ID: Randy Waters, male    DOB: 1961-01-28, 55 y.o.   MRN: [id number]  HPI Here for 3 days of swelling and pain in the left 4th finger. No recent trauma. He does have gout attacks fairly frequently. Taking indomethacin with little benefit.   Review of Systems  Constitutional: Negative.   Musculoskeletal: Positive for joint swelling and arthralgias.       Objective:   Physical Exam  Constitutional: He appears well-developed and well-nourished.  Musculoskeletal:  The PIP of the left 4th finger is red, warm, swollen, and very tender           Assessment & Plan:  Gout, treat with a Medrol dose pack. Check a uric acid level.  Laurey Morale, MD

## 2016-01-25 NOTE — Progress Notes (Signed)
left bundle branch block   

## 2016-01-28 ENCOUNTER — Other Ambulatory Visit: Payer: Self-pay | Admitting: *Deleted

## 2016-01-28 MED ORDER — GLIPIZIDE 10 MG PO TABS: 10.0000 mg | ORAL_TABLET | Freq: Two times a day (BID) | ORAL | Status: DC

## 2016-01-28 MED ORDER — ALLOPURINOL 300 MG PO TABS: 300.0000 mg | ORAL_TABLET | Freq: Every day | ORAL | Status: DC

## 2016-04-26 ENCOUNTER — Other Ambulatory Visit: Payer: Self-pay | Admitting: Family Medicine

## 2016-05-24 ENCOUNTER — Ambulatory Visit (INDEPENDENT_AMBULATORY_CARE_PROVIDER_SITE_OTHER): Payer: Managed Care, Other (non HMO) | Admitting: Family Medicine

## 2016-05-24 ENCOUNTER — Encounter: Payer: Self-pay | Admitting: Family Medicine

## 2016-05-24 VITALS — BP 104/75 | HR 138 | Temp 98.6°F | Ht 70.0 in | Wt 268.0 lb

## 2016-05-24 DIAGNOSIS — G8929 Other chronic pain: Secondary | ICD-10-CM

## 2016-05-24 DIAGNOSIS — R1011 Right upper quadrant pain: Principal | ICD-10-CM

## 2016-05-24 DIAGNOSIS — R1010 Upper abdominal pain, unspecified: Secondary | ICD-10-CM

## 2016-05-24 MED ORDER — POTASSIUM CITRATE ER 10 MEQ (1080 MG) PO TBCR: 10.0000 meq | EXTENDED_RELEASE_TABLET | Freq: Two times a day (BID) | ORAL | 3 refills | Status: DC

## 2016-05-24 MED ORDER — RANITIDINE HCL 150 MG PO TABS: 150.0000 mg | ORAL_TABLET | Freq: Every day | ORAL | 0 refills | Status: DC

## 2016-05-24 MED ORDER — OMEPRAZOLE 40 MG PO CPDR: 40.0000 mg | DELAYED_RELEASE_CAPSULE | Freq: Every day | ORAL | 3 refills | Status: DC

## 2016-05-24 NOTE — Progress Notes (Signed)
Pre visit review using our clinic review tool, if applicable. No additional management support is needed unless otherwise documented below in the visit note. 

## 2016-05-24 NOTE — Progress Notes (Signed)
   Subjective:    Patient ID: Randy Waters, male    DOB: 12-09-1960, 55 y.o.   MRN: [id number]  HPI Here for 6 months of frequent epigastric pains and nausea, with occasional vomiting. These spells can come at any time but they are most frequent just after a meal. No cough or SOB. His BMs are regular. No fever or jaundice.    Review of Systems  Constitutional: Negative.   Respiratory: Negative.   Cardiovascular: Negative.   Gastrointestinal: Positive for abdominal pain, nausea and vomiting. Negative for abdominal distention, anal bleeding, blood in stool, constipation and diarrhea.  Genitourinary: Negative.        Objective:   Physical Exam  Constitutional: He is oriented to person, place, and time. He appears well-developed and well-nourished. No distress.  Eyes: No scleral icterus.  Neck: No thyromegaly present.  Cardiovascular: Normal rate, regular rhythm, normal heart sounds and intact distal pulses.   Pulmonary/Chest: Effort normal and breath sounds normal. No respiratory distress. He has no wheezes. He has no rales.  Abdominal: Soft. Bowel sounds are normal. He exhibits no distension and no mass. There is no rebound and no guarding.  Tender in the epigastrium and RUQ   Lymphadenopathy:    He has no cervical adenopathy.  Neurological: He is alert and oriented to person, place, and time.          Assessment & Plan:  Upper abdominal pain with nausea, suggestive of duodenitis or an ulcer. He will start on Omeprazole 40 mg every am and Zantac 150 mg every pm. Get an [place] to rule out gall bladder disease.  Laurey Morale, MD

## 2016-06-12 ENCOUNTER — Ambulatory Visit
Admission: RE | Admit: 2016-06-12 | Discharge: 2016-06-12 | Disposition: A | Discharge status: Home / Self Care | Payer: Managed Care, Other (non HMO) | Source: Ambulatory Visit | Attending: Family Medicine | Admitting: Family Medicine

## 2016-06-12 DIAGNOSIS — R1011 Right upper quadrant pain: Principal | ICD-10-CM

## 2016-06-12 DIAGNOSIS — G8929 Other chronic pain: Secondary | ICD-10-CM

## 2016-10-10 ENCOUNTER — Emergency Department
Admission: EM | Admit: 2016-10-10 | Discharge: 2016-10-10 | Disposition: A | Discharge status: Home / Self Care | Payer: Managed Care, Other (non HMO) | Source: Home / Self Care | Attending: Family Medicine | Admitting: Family Medicine

## 2016-10-10 ENCOUNTER — Encounter: Payer: Self-pay | Admitting: *Deleted

## 2016-10-10 ENCOUNTER — Emergency Department (INDEPENDENT_AMBULATORY_CARE_PROVIDER_SITE_OTHER): Payer: Managed Care, Other (non HMO)

## 2016-10-10 DIAGNOSIS — R05 Cough: Secondary | ICD-10-CM | POA: Diagnosis not present

## 2016-10-10 DIAGNOSIS — I10 Essential (primary) hypertension: Secondary | ICD-10-CM

## 2016-10-10 DIAGNOSIS — J209 Acute bronchitis, unspecified: Secondary | ICD-10-CM | POA: Diagnosis not present

## 2016-10-10 DIAGNOSIS — R509 Fever, unspecified: Secondary | ICD-10-CM

## 2016-10-10 MED ORDER — AZITHROMYCIN 250 MG PO TABS: ORAL_TABLET | ORAL | 0 refills | Status: DC

## 2016-10-10 MED ORDER — BENZONATATE 200 MG PO CAPS: ORAL_CAPSULE | ORAL | 0 refills | Status: DC

## 2016-10-10 NOTE — ED Triage Notes (Signed)
Pt c/o nonproductive cough, temp 100.1, and body aches x 5 days.
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
## 2016-10-10 NOTE — Discharge Instructions (Signed)
Take plain guaifenesin (1200mg extended release tabs such as Mucinex) twice daily, with plenty of water, for cough and congestion.  Get adequate rest.   °May use Afrin nasal spray (or generic oxymetazoline) twice daily for about 5 days and then discontinue.  Also recommend using saline nasal spray several times daily and saline nasal irrigation (AYR is a common brand).  Use Flonase nasal spray each morning after using Afrin nasal spray and saline nasal irrigation. °Try warm salt water gargles for sore throat.  °Stop all antihistamines for now, and other non-prescription cough/cold preparations. °  °  °  °

## 2016-10-27 ENCOUNTER — Other Ambulatory Visit: Payer: Self-pay | Admitting: Family Medicine

## 2016-11-20 ENCOUNTER — Encounter: Payer: Self-pay | Admitting: Family Medicine

## 2016-11-20 LAB — HM DIABETES EYE EXAM

## 2016-11-29 ENCOUNTER — Encounter: Payer: Self-pay | Admitting: Family Medicine

## 2017-03-21 ENCOUNTER — Other Ambulatory Visit: Payer: Self-pay | Admitting: Family Medicine

## 2017-05-01 ENCOUNTER — Encounter: Payer: Self-pay | Admitting: Family Medicine

## 2017-05-01 ENCOUNTER — Ambulatory Visit (INDEPENDENT_AMBULATORY_CARE_PROVIDER_SITE_OTHER): Payer: Managed Care, Other (non HMO) | Admitting: Family Medicine

## 2017-05-01 VITALS — BP 136/88 | Temp 98.6°F | Ht 71.0 in | Wt 265.0 lb

## 2017-05-01 DIAGNOSIS — Z23 Encounter for immunization: Secondary | ICD-10-CM | POA: Diagnosis not present

## 2017-05-01 DIAGNOSIS — Z0000 Encounter for general adult medical examination without abnormal findings: Secondary | ICD-10-CM | POA: Diagnosis not present

## 2017-05-01 DIAGNOSIS — M1000 Idiopathic gout, unspecified site: Secondary | ICD-10-CM

## 2017-05-01 DIAGNOSIS — E119 Type 2 diabetes mellitus without complications: Secondary | ICD-10-CM | POA: Diagnosis not present

## 2017-05-01 LAB — POC URINALSYSI DIPSTICK (AUTOMATED)
BILIRUBIN UA: NEGATIVE
Blood, UA: NEGATIVE
CLARITY UA: NEGATIVE
GLUCOSE UA: NEGATIVE
LEUKOCYTES UA: NEGATIVE
Nitrite, UA: NEGATIVE
PROTEIN UA: NEGATIVE
Urobilinogen, UA: 0.2 E.U./dL
pH, UA: 5.5 (ref 5.0–8.0)

## 2017-05-01 LAB — CBC WITH DIFFERENTIAL/PLATELET
BASOS ABS: 0.1 10*3/uL (ref 0.0–0.1)
BASOS PCT: 0.9 % (ref 0.0–3.0)
EOS ABS: 0.2 10*3/uL (ref 0.0–0.7)
Eosinophils Relative: 3.7 % (ref 0.0–5.0)
HCT: 47.3 % (ref 39.0–52.0)
Hemoglobin: 15.9 g/dL (ref 13.0–17.0)
LYMPHS ABS: 1.8 10*3/uL (ref 0.7–4.0)
LYMPHS PCT: 27.7 % (ref 12.0–46.0)
MCHC: 33.6 g/dL (ref 30.0–36.0)
MCV: 94.7 fl (ref 78.0–100.0)
Monocytes Absolute: 0.5 10*3/uL (ref 0.1–1.0)
Monocytes Relative: 7.2 % (ref 3.0–12.0)
NEUTROS ABS: 3.8 10*3/uL (ref 1.4–7.7)
NEUTROS PCT: 60.5 % (ref 43.0–77.0)
Platelets: 250 10*3/uL (ref 150.0–400.0)
RBC: 4.99 Mil/uL (ref 4.22–5.81)
RDW: 12.7 % (ref 11.5–15.5)
WBC: 6.3 10*3/uL (ref 4.0–10.5)

## 2017-05-01 LAB — HEPATIC FUNCTION PANEL
ALT: 29 U/L (ref 0–53)
AST: 23 U/L (ref 0–37)
Albumin: 4.4 g/dL (ref 3.5–5.2)
Alkaline Phosphatase: 89 U/L (ref 39–117)
BILIRUBIN DIRECT: 0.1 mg/dL (ref 0.0–0.3)
BILIRUBIN TOTAL: 0.7 mg/dL (ref 0.2–1.2)
TOTAL PROTEIN: 6.5 g/dL (ref 6.0–8.3)

## 2017-05-01 LAB — BASIC METABOLIC PANEL
BUN: 16 mg/dL (ref 6–23)
CALCIUM: 9.6 mg/dL (ref 8.4–10.5)
CHLORIDE: 102 meq/L (ref 96–112)
CO2: 25 meq/L (ref 19–32)
CREATININE: 1.08 mg/dL (ref 0.40–1.50)
GFR: 74.97 mL/min (ref >60.00)
Glucose, Bld: 299 mg/dL — ABNORMAL HIGH (ref 70–99)
Potassium: 4.3 mEq/L (ref 3.5–5.1)
Sodium: 139 mEq/L (ref 135–145)

## 2017-05-01 LAB — LIPID PANEL
CHOL/HDL RATIO: 6
CHOLESTEROL: 165 mg/dL (ref 0–200)
HDL: 25.9 mg/dL — AB (ref >39.00)
NonHDL: 139
TRIGLYCERIDES: 245 mg/dL — AB (ref 0.0–149.0)
VLDL: 49 mg/dL — ABNORMAL HIGH (ref 0.0–40.0)

## 2017-05-01 LAB — TSH: TSH: 1.89 u[IU]/mL (ref 0.35–4.50)

## 2017-05-01 LAB — URIC ACID: URIC ACID, SERUM: 7.7 mg/dL (ref 4.0–7.8)

## 2017-05-01 LAB — PSA: PSA: 1.38 ng/mL (ref 0.10–4.00)

## 2017-05-01 LAB — LDL CHOLESTEROL, DIRECT: LDL DIRECT: 102 mg/dL

## 2017-05-01 LAB — HEMOGLOBIN A1C: Hgb A1c MFr Bld: 9.7 % — ABNORMAL HIGH (ref 4.6–6.5)

## 2017-05-01 MED ORDER — GLIPIZIDE 10 MG PO TABS: 10.0000 mg | ORAL_TABLET | Freq: Two times a day (BID) | ORAL | 3 refills | Status: DC

## 2017-05-01 MED ORDER — LISINOPRIL 20 MG PO TABS: 20.0000 mg | ORAL_TABLET | Freq: Every day | ORAL | 3 refills | Status: DC

## 2017-05-01 MED ORDER — OMEPRAZOLE 40 MG PO CPDR: 40.0000 mg | DELAYED_RELEASE_CAPSULE | Freq: Every day | ORAL | 3 refills | Status: DC | PRN

## 2017-05-01 MED ORDER — INDOMETHACIN 50 MG PO CAPS: 50.0000 mg | ORAL_CAPSULE | Freq: Three times a day (TID) | ORAL | 5 refills | Status: DC | PRN

## 2017-05-01 MED ORDER — ALLOPURINOL 300 MG PO TABS: 300.0000 mg | ORAL_TABLET | Freq: Every day | ORAL | 3 refills | Status: DC

## 2017-05-01 MED ORDER — METFORMIN HCL 1000 MG PO TABS: 1000.0000 mg | ORAL_TABLET | Freq: Two times a day (BID) | ORAL | 3 refills | Status: DC

## 2017-05-01 NOTE — Progress Notes (Signed)
   Subjective:    Patient ID: Randy Waters, male    DOB: 03/30/1961, 56 y.o.   MRN: [id number]  HPI Here for a well exam. He feels fine. His BP is stable. He does not check his glucoses at home. He says managing his diet is difficult since he travels extensively on his job.    Review of Systems  Constitutional: Negative.   HENT: Negative.   Eyes: Negative.   Respiratory: Negative.   Cardiovascular: Negative.   Gastrointestinal: Negative.   Genitourinary: Negative.   Musculoskeletal: Negative.   Skin: Negative.   Neurological: Negative.   Psychiatric/Behavioral: Negative.        Objective:   Physical Exam  Constitutional: He is oriented to person, place, and time. He appears well-developed and well-nourished. No distress.  HENT:  Head: Normocephalic and atraumatic.  Right Ear: External ear normal.  Left Ear: External ear normal.  Nose: Nose normal.  Mouth/Throat: Oropharynx is clear and moist. No oropharyngeal exudate.  Eyes: Pupils are equal, round, and reactive to light. Conjunctivae and EOM are normal. Right eye exhibits no discharge. Left eye exhibits no discharge. No scleral icterus.  Neck: Neck supple. No JVD present. No tracheal deviation present. No thyromegaly present.  Cardiovascular: Normal rate, regular rhythm, normal heart sounds and intact distal pulses.  Exam reveals no gallop and no friction rub.   No murmur heard. Pulmonary/Chest: Effort normal and breath sounds normal. No respiratory distress. He has no wheezes. He has no rales. He exhibits no tenderness.  Abdominal: Soft. Bowel sounds are normal. He exhibits no distension and no mass. There is no tenderness. There is no rebound and no guarding.  Genitourinary: Rectum normal, prostate normal and penis normal. Rectal exam shows guaiac negative stool. No penile tenderness.  Musculoskeletal: Normal range of motion. He exhibits no edema or tenderness.  Lymphadenopathy:    He has no cervical adenopathy.    Neurological: He is alert and oriented to person, place, and time. He has normal reflexes. No cranial nerve deficit. He exhibits normal muscle tone. Coordination normal.  Skin: Skin is warm and dry. No rash noted. He is not diaphoretic. No erythema. No pallor.  Psychiatric: He has a normal mood and affect. His behavior is normal. Judgment and thought content normal.          Assessment & Plan:  Well exam. We discussed diet and exercise. Get fasting labs. He is past due for a colonoscopy so we will send him to GI.  Alysia Penna, MD

## 2017-05-01 NOTE — Patient Instructions (Signed)
WE NOW OFFER   [facility] FAST TRACK!!!  SAME DAY Appointments for ACUTE CARE  Such as: Sprains, Injuries, cuts, abrasions, rashes, muscle pain, joint pain, back pain Colds, flu, sore throats, headache, allergies, cough, fever  Ear pain, sinus and eye infections Abdominal pain, nausea, vomiting, diarrhea, upset stomach Animal/insect bites  3 Easy Ways to Schedule: Walk-In Scheduling Call in scheduling Mychart Sign-up: [email or web address]         

## 2017-05-02 ENCOUNTER — Other Ambulatory Visit: Payer: Self-pay | Admitting: Family Medicine

## 2017-05-02 MED ORDER — SITAGLIPTIN PHOSPHATE 100 MG PO TABS: 100.0000 mg | ORAL_TABLET | Freq: Every day | ORAL | 3 refills | Status: DC

## 2017-05-17 ENCOUNTER — Encounter: Payer: Self-pay | Admitting: Family Medicine

## 2017-05-31 ENCOUNTER — Encounter: Payer: Self-pay | Admitting: Family Medicine

## 2017-07-27 ENCOUNTER — Other Ambulatory Visit: Payer: Self-pay | Admitting: Family Medicine

## 2017-07-27 NOTE — Telephone Encounter (Signed)
Sent to PCP for approval. Last OV 05/01/2017. Rx was last refilled 05/24/2016 disp 180 with 3 refills.

## 2017-10-15 ENCOUNTER — Ambulatory Visit: Payer: Managed Care, Other (non HMO) | Admitting: Family Medicine

## 2017-10-15 ENCOUNTER — Encounter: Payer: Self-pay | Admitting: Family Medicine

## 2017-10-15 VITALS — BP 120/80 | HR 122 | Temp 98.4°F | Ht 71.0 in | Wt 269.0 lb

## 2017-10-15 DIAGNOSIS — I10 Essential (primary) hypertension: Secondary | ICD-10-CM | POA: Diagnosis not present

## 2017-10-15 DIAGNOSIS — E119 Type 2 diabetes mellitus without complications: Secondary | ICD-10-CM

## 2017-10-15 LAB — HEMOGLOBIN A1C: Hgb A1c MFr Bld: 9 % — ABNORMAL HIGH (ref 4.6–6.5)

## 2017-10-15 NOTE — Progress Notes (Signed)
   Subjective:    Patient ID: Randy Waters, male    DOB: Apr 18, 1961, 57 y.o.   MRN: [id number]  HPI Here to follow up on diabetes. He feels well. He took Januvia for awhile, but he stopped this 2 months ago due to the cost. He has tightened up on his diet.    Review of Systems  Constitutional: Negative.   Respiratory: Negative.   Cardiovascular: Negative.   Neurological: Negative.        Objective:   Physical Exam  Constitutional: He is oriented to person, place, and time. He appears well-developed and well-nourished.  Cardiovascular: Normal rate, regular rhythm, normal heart sounds and intact distal pulses.  Pulmonary/Chest: Effort normal and breath sounds normal. No respiratory distress. He has no wheezes. He has no rales.  Neurological: He is alert and oriented to person, place, and time.          Assessment & Plan:  Diabetes, check an A1c today. Alysia Penna, MD

## 2017-10-17 ENCOUNTER — Other Ambulatory Visit: Payer: Self-pay

## 2017-10-17 MED ORDER — PIOGLITAZONE HCL 30 MG PO TABS: 30.0000 mg | ORAL_TABLET | Freq: Every day | ORAL | 3 refills | Status: DC

## 2017-10-17 NOTE — Telephone Encounter (Signed)
Actos (generic and affordable) 30 mg daily. Call in #90 with 3 rf.   Rx has been sent and pt is aware. Pt was advised to f/u in 3 months to recheck A1c

## 2018-01-22 LAB — HM DIABETES EYE EXAM

## 2018-06-21 ENCOUNTER — Ambulatory Visit: Payer: Managed Care, Other (non HMO) | Admitting: Family Medicine

## 2018-06-21 ENCOUNTER — Encounter: Payer: Self-pay | Admitting: Family Medicine

## 2018-06-21 VITALS — BP 136/80 | HR 106 | Temp 98.6°F | Ht 71.0 in | Wt 275.2 lb

## 2018-06-21 DIAGNOSIS — J029 Acute pharyngitis, unspecified: Secondary | ICD-10-CM | POA: Diagnosis not present

## 2018-06-21 LAB — POCT RAPID STREP A (OFFICE): Rapid Strep A Screen: NEGATIVE

## 2018-06-21 MED ORDER — AZITHROMYCIN 250 MG PO TABS: ORAL_TABLET | ORAL | 0 refills | Status: DC

## 2018-06-21 NOTE — Progress Notes (Signed)
Subjective:  Patient ID: Randy Waters, male    DOB: 12/21/1960  Age: 57 y.o. MRN: [id number]  CC: Sore Throat (started a few days ago, sore throat became bad yesterday; drainage)   HPI Randy Waters presents for 1 day history of sore throat with malaise, myalgias, postnasal drip.  Patient denies headache, nausea and vomiting cough or poor appetite.  He has been taking ibuprofen.  Outpatient Medications Prior to Visit  Medication Sig Dispense Refill  . allopurinol (ZYLOPRIM) 300 MG tablet Take 1 tablet (300 mg total) by mouth daily. 90 tablet 3  . glipiZIDE (GLUCOTROL) 10 MG tablet Take 1 tablet (10 mg total) by mouth 2 (two) times daily before a meal. 180 tablet 3  . indomethacin (INDOCIN) 50 MG capsule Take 1 capsule (50 mg total) by mouth 3 (three) times daily as needed. 60 capsule 5  . lisinopril (PRINIVIL,ZESTRIL) 20 MG tablet Take 1 tablet (20 mg total) by mouth daily. 90 tablet 3  . metFORMIN (GLUCOPHAGE) 1000 MG tablet Take 1 tablet (1,000 mg total) by mouth 2 (two) times daily. 180 tablet 3  . omeprazole (PRILOSEC) 40 MG capsule Take 1 capsule (40 mg total) by mouth daily as needed. 90 capsule 3  . pioglitazone (ACTOS) 30 MG tablet Take 1 tablet (30 mg total) by mouth daily. 90 tablet 3  . potassium citrate (UROCIT-K) 10 MEQ (1080 MG) SR tablet TAKE ONE TABLET BY MOUTH TWICE DAILY 180 tablet 3  . ranitidine (ZANTAC) 150 MG tablet Take 1 tablet (150 mg total) by mouth at bedtime. (Patient taking differently: Take 150 mg by mouth daily as needed. Over the counter) 30 tablet 0  . sitaGLIPtin (JANUVIA) 100 MG tablet Take 1 tablet (100 mg total) by mouth daily. 90 tablet 3   No facility-administered medications prior to visit.     ROS Review of Systems  Constitutional: Positive for fatigue. Negative for chills, fever and unexpected weight change.  HENT: Positive for congestion, postnasal drip and sore throat. Negative for rhinorrhea, sinus pressure, sinus pain, sneezing and  trouble swallowing.   Eyes: Negative for photophobia and visual disturbance.  Respiratory: Negative for cough.   Cardiovascular: Negative.  Negative for palpitations.  Gastrointestinal: Negative for abdominal pain, nausea and vomiting.  Endocrine: Negative for polyphagia and polyuria.  Genitourinary: Negative.   Musculoskeletal: Positive for arthralgias and myalgias.  Skin: Negative for pallor and rash.  Neurological: Negative for headaches.  Hematological: Does not bruise/bleed easily.  Psychiatric/Behavioral: Negative.     Objective:  BP 136/80   Pulse (!) 106   Temp 98.6 F (37 C) (Oral)   Ht 5\' 11"  (1.803 m)   Wt 275 lb 4 oz (124.9 kg)   SpO2 96%   BMI 38.39 kg/m   BP Readings from Last 3 Encounters:  06/21/18 136/80  10/15/17 120/80  05/01/17 136/88    Wt Readings from Last 3 Encounters:  06/21/18 275 lb 4 oz (124.9 kg)  10/15/17 269 lb (122 kg)  05/01/17 265 lb (120.2 kg)    Physical Exam  Constitutional: He is oriented to person, place, and time. He appears well-developed and well-nourished.  Non-toxic appearance. He does not appear ill. No distress.  HENT:  Head: Normocephalic and atraumatic.  Right Ear: Hearing and tympanic membrane normal. No drainage. No middle ear effusion.  Left Ear: Hearing normal. No drainage.  No middle ear effusion.  Mouth/Throat: Mucous membranes are normal. Posterior oropharyngeal edema and posterior oropharyngeal erythema present. No tonsillar abscesses.  Eyes:  Pupils are equal, round, and reactive to light.  Neck: No thyromegaly present.  Cardiovascular: Regular rhythm and normal heart sounds. Exam reveals no gallop.  Pulmonary/Chest: Effort normal and breath sounds normal.  Lymphadenopathy:    He has no cervical adenopathy.  Neurological: He is alert and oriented to person, place, and time.  Skin: Skin is warm and dry.    Lab Results  Component Value Date   WBC 6.3 05/01/2017   HGB 15.9 05/01/2017   HCT 47.3 05/01/2017     PLT 250.0 05/01/2017   GLUCOSE 299 (H) 05/01/2017   CHOL 165 05/01/2017   TRIG 245.0 (H) 05/01/2017   HDL 25.90 (L) 05/01/2017   LDLDIRECT 102.0 05/01/2017   LDLCALC 115 (H) 05/19/2014   ALT 29 05/01/2017   AST 23 05/01/2017   NA 139 05/01/2017   K 4.3 05/01/2017   CL 102 05/01/2017   CREATININE 1.08 05/01/2017   BUN 16 05/01/2017   CO2 25 05/01/2017   TSH 1.89 05/01/2017   PSA 1.38 05/01/2017   HGBA1C 9.0 (H) 10/15/2017   MICROALBUR 3.3 (H) 02/03/2014    Dg Chest 2 View  Result Date: 10/10/2016 CLINICAL DATA:  Cough for 6 days, increasing fever, hypertension, type II diabetes mellitus EXAM: CHEST  2 VIEW COMPARISON:  None FINDINGS: Normal heart size, mediastinal contours, and pulmonary vascularity. Mild peribronchial thickening. No pulmonary infiltrate, pleural effusion, or pneumothorax. Bones unremarkable. IMPRESSION: Bronchitic changes without infiltrate. Electronically Signed   By: Lavonia Dana M.D.   On: 10/10/2016 12:10    Assessment & Plan:   Randy Waters for sore throat.  Diagnoses and all orders for this visit:  Sore throat -     POC Rapid Strep A -     azithromycin (ZITHROMAX) 250 MG tablet; Take two on first day and then one each day until finished.   I am having Randy Waters start on azithromycin. I am also having him maintain his ranitidine, allopurinol, glipiZIDE, indomethacin, lisinopril, metFORMIN, omeprazole, sitaGLIPtin, potassium citrate, and pioglitazone.  Meds ordered this encounter  Medications  . azithromycin (ZITHROMAX) 250 MG tablet    Sig: Take two on first day and then one each day until finished.    Dispense:  6 tablet    Refill:  0    Allow to hold, please.     Follow-up: No follow-ups on file.  Randy Maw, MD

## 2018-06-26 ENCOUNTER — Encounter: Payer: Self-pay | Admitting: Family Medicine

## 2018-06-26 ENCOUNTER — Ambulatory Visit: Payer: Managed Care, Other (non HMO) | Admitting: Family Medicine

## 2018-06-26 VITALS — BP 138/100 | HR 88 | Temp 98.4°F | Wt 270.5 lb

## 2018-06-26 DIAGNOSIS — E785 Hyperlipidemia, unspecified: Secondary | ICD-10-CM | POA: Diagnosis not present

## 2018-06-26 DIAGNOSIS — I10 Essential (primary) hypertension: Secondary | ICD-10-CM

## 2018-06-26 DIAGNOSIS — Z23 Encounter for immunization: Secondary | ICD-10-CM | POA: Diagnosis not present

## 2018-06-26 DIAGNOSIS — E119 Type 2 diabetes mellitus without complications: Secondary | ICD-10-CM | POA: Diagnosis not present

## 2018-06-26 DIAGNOSIS — M1000 Idiopathic gout, unspecified site: Secondary | ICD-10-CM

## 2018-06-26 LAB — POC URINALSYSI DIPSTICK (AUTOMATED)
BILIRUBIN UA: NEGATIVE
Blood, UA: NEGATIVE
Glucose, UA: NEGATIVE
KETONES UA: NEGATIVE
LEUKOCYTES UA: NEGATIVE
Nitrite, UA: NEGATIVE
PH UA: 5.5 (ref 5.0–8.0)
PROTEIN UA: NEGATIVE
Urobilinogen, UA: 0.2 E.U./dL

## 2018-06-26 LAB — BASIC METABOLIC PANEL
BUN: 20 mg/dL (ref 6–23)
CHLORIDE: 105 meq/L (ref 96–112)
CO2: 26 meq/L (ref 19–32)
CREATININE: 1.09 mg/dL (ref 0.40–1.50)
Calcium: 9.9 mg/dL (ref 8.4–10.5)
GFR: 73.88 mL/min (ref >60.00)
Glucose, Bld: 134 mg/dL — ABNORMAL HIGH (ref 70–99)
Potassium: 4.5 mEq/L (ref 3.5–5.1)
Sodium: 143 mEq/L (ref 135–145)

## 2018-06-26 LAB — LIPID PANEL
Cholesterol: 170 mg/dL (ref 0–200)
HDL: 26.1 mg/dL — ABNORMAL LOW (ref >39.00)
NONHDL: 143.46
Total CHOL/HDL Ratio: 6
Triglycerides: 216 mg/dL — ABNORMAL HIGH (ref 0.0–149.0)
VLDL: 43.2 mg/dL — ABNORMAL HIGH (ref 0.0–40.0)

## 2018-06-26 LAB — CBC WITH DIFFERENTIAL/PLATELET
BASOS ABS: 0.1 10*3/uL (ref 0.0–0.1)
Basophils Relative: 0.7 % (ref 0.0–3.0)
Eosinophils Absolute: 0.2 10*3/uL (ref 0.0–0.7)
Eosinophils Relative: 3 % (ref 0.0–5.0)
HCT: 44.7 % (ref 39.0–52.0)
Hemoglobin: 15.1 g/dL (ref 13.0–17.0)
LYMPHS ABS: 2 10*3/uL (ref 0.7–4.0)
Lymphocytes Relative: 26.5 % (ref 12.0–46.0)
MCHC: 33.8 g/dL (ref 30.0–36.0)
MCV: 93.5 fl (ref 78.0–100.0)
MONO ABS: 0.4 10*3/uL (ref 0.1–1.0)
MONOS PCT: 5.7 % (ref 3.0–12.0)
NEUTROS PCT: 64.1 % (ref 43.0–77.0)
Neutro Abs: 4.9 10*3/uL (ref 1.4–7.7)
Platelets: 317 10*3/uL (ref 150.0–400.0)
RBC: 4.78 Mil/uL (ref 4.22–5.81)
RDW: 13.1 % (ref 11.5–15.5)
WBC: 7.7 10*3/uL (ref 4.0–10.5)

## 2018-06-26 LAB — HEPATIC FUNCTION PANEL
ALBUMIN: 4.5 g/dL (ref 3.5–5.2)
ALK PHOS: 67 U/L (ref 39–117)
ALT: 24 U/L (ref 0–53)
AST: 23 U/L (ref 0–37)
BILIRUBIN DIRECT: 0.1 mg/dL (ref 0.0–0.3)
TOTAL PROTEIN: 6.8 g/dL (ref 6.0–8.3)
Total Bilirubin: 0.4 mg/dL (ref 0.2–1.2)

## 2018-06-26 LAB — HEMOGLOBIN A1C: Hgb A1c MFr Bld: 7.8 % — ABNORMAL HIGH (ref 4.6–6.5)

## 2018-06-26 LAB — TSH: TSH: 1.42 u[IU]/mL (ref 0.35–4.50)

## 2018-06-26 LAB — LDL CHOLESTEROL, DIRECT: Direct LDL: 134 mg/dL

## 2018-06-26 MED ORDER — GLIPIZIDE 10 MG PO TABS: 10.0000 mg | ORAL_TABLET | Freq: Two times a day (BID) | ORAL | 3 refills | Status: DC

## 2018-06-26 MED ORDER — LISINOPRIL 20 MG PO TABS: 20.0000 mg | ORAL_TABLET | Freq: Every day | ORAL | 3 refills | Status: DC

## 2018-06-26 MED ORDER — ALLOPURINOL 300 MG PO TABS: 300.0000 mg | ORAL_TABLET | Freq: Every day | ORAL | 3 refills | Status: DC

## 2018-06-26 MED ORDER — METHOCARBAMOL 750 MG PO TABS: 750.0000 mg | ORAL_TABLET | Freq: Four times a day (QID) | ORAL | 5 refills | Status: DC | PRN

## 2018-06-26 MED ORDER — POTASSIUM CITRATE ER 10 MEQ (1080 MG) PO TBCR: 10.0000 meq | EXTENDED_RELEASE_TABLET | Freq: Two times a day (BID) | ORAL | 3 refills | Status: DC

## 2018-06-26 MED ORDER — INDOMETHACIN 50 MG PO CAPS: 50.0000 mg | ORAL_CAPSULE | Freq: Three times a day (TID) | ORAL | 5 refills | Status: DC | PRN

## 2018-06-26 MED ORDER — METFORMIN HCL 1000 MG PO TABS: 1000.0000 mg | ORAL_TABLET | Freq: Two times a day (BID) | ORAL | 3 refills | Status: DC

## 2018-06-26 NOTE — Progress Notes (Signed)
   Subjective:    Patient ID: Randy Waters, male    DOB: 05-21-1961, 57 y.o.   MRN: [id number]  HPI Here to follow up. He feels fine. He just got over an upper respiratory infection. His BP has been wel controlled until he ran out of medications a few weeks ago.    Review of Systems  Constitutional: Negative.   Respiratory: Negative.   Cardiovascular: Negative.   Neurological: Negative.        Objective:   Physical Exam  Constitutional: He is oriented to person, place, and time. He appears well-developed and well-nourished.  Neck: No thyromegaly present.  Cardiovascular: Normal rate, regular rhythm, normal heart sounds and intact distal pulses.  Pulmonary/Chest: Effort normal and breath sounds normal.  Lymphadenopathy:    He has no cervical adenopathy.  Neurological: He is alert and oriented to person, place, and time.          Assessment & Plan:  His HTN I stable. We will get fasting labs to check an A1c, lipids, etc.  Alysia Penna, MD

## 2018-07-25 ENCOUNTER — Other Ambulatory Visit: Payer: Self-pay

## 2018-07-25 ENCOUNTER — Emergency Department (HOSPITAL_BASED_OUTPATIENT_CLINIC_OR_DEPARTMENT_OTHER): Payer: Managed Care, Other (non HMO)

## 2018-07-25 ENCOUNTER — Inpatient Hospital Stay (HOSPITAL_BASED_OUTPATIENT_CLINIC_OR_DEPARTMENT_OTHER)
Admission: EM | Admit: 2018-07-25 | Discharge: 2018-07-29 | DRG: 149 | Disposition: A | Discharge status: Home / Self Care | Payer: Managed Care, Other (non HMO) | Attending: Internal Medicine | Admitting: Internal Medicine

## 2018-07-25 ENCOUNTER — Encounter (HOSPITAL_BASED_OUTPATIENT_CLINIC_OR_DEPARTMENT_OTHER): Payer: Self-pay | Admitting: *Deleted

## 2018-07-25 DIAGNOSIS — E1165 Type 2 diabetes mellitus with hyperglycemia: Secondary | ICD-10-CM | POA: Diagnosis present

## 2018-07-25 DIAGNOSIS — Z87442 Personal history of urinary calculi: Secondary | ICD-10-CM

## 2018-07-25 DIAGNOSIS — R262 Difficulty in walking, not elsewhere classified: Secondary | ICD-10-CM | POA: Diagnosis present

## 2018-07-25 DIAGNOSIS — I10 Essential (primary) hypertension: Secondary | ICD-10-CM | POA: Diagnosis present

## 2018-07-25 DIAGNOSIS — W19XXXA Unspecified fall, initial encounter: Secondary | ICD-10-CM | POA: Diagnosis not present

## 2018-07-25 DIAGNOSIS — I5032 Chronic diastolic (congestive) heart failure: Secondary | ICD-10-CM | POA: Diagnosis present

## 2018-07-25 DIAGNOSIS — E785 Hyperlipidemia, unspecified: Secondary | ICD-10-CM | POA: Diagnosis present

## 2018-07-25 DIAGNOSIS — Z6837 Body mass index (BMI) 37.0-37.9, adult: Secondary | ICD-10-CM | POA: Diagnosis not present

## 2018-07-25 DIAGNOSIS — N182 Chronic kidney disease, stage 2 (mild): Secondary | ICD-10-CM | POA: Diagnosis present

## 2018-07-25 DIAGNOSIS — H5500 Unspecified nystagmus: Secondary | ICD-10-CM | POA: Diagnosis not present

## 2018-07-25 DIAGNOSIS — H9312 Tinnitus, left ear: Secondary | ICD-10-CM | POA: Diagnosis present

## 2018-07-25 DIAGNOSIS — Z79899 Other long term (current) drug therapy: Secondary | ICD-10-CM | POA: Diagnosis not present

## 2018-07-25 DIAGNOSIS — M1990 Unspecified osteoarthritis, unspecified site: Secondary | ICD-10-CM | POA: Diagnosis not present

## 2018-07-25 DIAGNOSIS — Z7984 Long term (current) use of oral hypoglycemic drugs: Secondary | ICD-10-CM

## 2018-07-25 DIAGNOSIS — R42 Dizziness and giddiness: Principal | ICD-10-CM | POA: Diagnosis present

## 2018-07-25 DIAGNOSIS — E669 Obesity, unspecified: Secondary | ICD-10-CM | POA: Diagnosis not present

## 2018-07-25 DIAGNOSIS — Z885 Allergy status to narcotic agent status: Secondary | ICD-10-CM

## 2018-07-25 DIAGNOSIS — E860 Dehydration: Secondary | ICD-10-CM | POA: Diagnosis not present

## 2018-07-25 DIAGNOSIS — I639 Cerebral infarction, unspecified: Secondary | ICD-10-CM

## 2018-07-25 DIAGNOSIS — R000 Tachycardia, unspecified: Secondary | ICD-10-CM | POA: Diagnosis present

## 2018-07-25 DIAGNOSIS — K219 Gastro-esophageal reflux disease without esophagitis: Secondary | ICD-10-CM | POA: Diagnosis not present

## 2018-07-25 DIAGNOSIS — D72829 Elevated white blood cell count, unspecified: Secondary | ICD-10-CM | POA: Diagnosis present

## 2018-07-25 DIAGNOSIS — Z823 Family history of stroke: Secondary | ICD-10-CM

## 2018-07-25 DIAGNOSIS — E1169 Type 2 diabetes mellitus with other specified complication: Secondary | ICD-10-CM

## 2018-07-25 DIAGNOSIS — R112 Nausea with vomiting, unspecified: Secondary | ICD-10-CM

## 2018-07-25 DIAGNOSIS — E1129 Type 2 diabetes mellitus with other diabetic kidney complication: Secondary | ICD-10-CM | POA: Diagnosis present

## 2018-07-25 DIAGNOSIS — E872 Acidosis, unspecified: Secondary | ICD-10-CM | POA: Diagnosis present

## 2018-07-25 DIAGNOSIS — I130 Hypertensive heart and chronic kidney disease with heart failure and stage 1 through stage 4 chronic kidney disease, or unspecified chronic kidney disease: Secondary | ICD-10-CM | POA: Diagnosis not present

## 2018-07-25 DIAGNOSIS — M109 Gout, unspecified: Secondary | ICD-10-CM | POA: Diagnosis not present

## 2018-07-25 DIAGNOSIS — E1122 Type 2 diabetes mellitus with diabetic chronic kidney disease: Secondary | ICD-10-CM | POA: Diagnosis present

## 2018-07-25 DIAGNOSIS — R1110 Vomiting, unspecified: Secondary | ICD-10-CM

## 2018-07-25 LAB — COMPREHENSIVE METABOLIC PANEL
ALT: 34 U/L (ref 0–44)
AST: 34 U/L (ref 15–41)
Albumin: 4.6 g/dL (ref 3.5–5.0)
Alkaline Phosphatase: 70 U/L (ref 38–126)
Anion gap: 15 (ref 5–15)
BUN: 17 mg/dL (ref 6–20)
CO2: 21 mmol/L — ABNORMAL LOW (ref 22–32)
Calcium: 9.9 mg/dL (ref 8.9–10.3)
Chloride: 98 mmol/L (ref 98–111)
Creatinine, Ser: 1.2 mg/dL (ref 0.61–1.24)
GFR calc Af Amer: >60 mL/min (ref >60)
GFR calc non Af Amer: >60 mL/min (ref >60)
Glucose, Bld: 275 mg/dL — ABNORMAL HIGH (ref 70–99)
Potassium: 3.9 mmol/L (ref 3.5–5.1)
Sodium: 134 mmol/L — ABNORMAL LOW (ref 135–145)
Total Bilirubin: 1 mg/dL (ref 0.3–1.2)
Total Protein: 7.8 g/dL (ref 6.5–8.1)

## 2018-07-25 LAB — URINALYSIS, ROUTINE W REFLEX MICROSCOPIC
Bilirubin Urine: NEGATIVE
Glucose, UA: >=500 mg/dL — AB
Ketones, ur: 15 mg/dL — AB
Leukocytes, UA: NEGATIVE
Nitrite: NEGATIVE
Protein, ur: NEGATIVE mg/dL
Specific Gravity, Urine: 1.01 (ref 1.005–1.030)
pH: 6 (ref 5.0–8.0)

## 2018-07-25 LAB — CBC WITH DIFFERENTIAL/PLATELET
ABS IMMATURE GRANULOCYTES: 0.04 10*3/uL (ref 0.00–0.07)
Basophils Absolute: 0 10*3/uL (ref 0.0–0.1)
Basophils Relative: 0 %
Eosinophils Absolute: 0 10*3/uL (ref 0.0–0.5)
Eosinophils Relative: 0 %
HCT: 48.4 % (ref 39.0–52.0)
HEMOGLOBIN: 15.7 g/dL (ref 13.0–17.0)
Immature Granulocytes: 0 %
Lymphocytes Relative: 14 %
Lymphs Abs: 1.5 10*3/uL (ref 0.7–4.0)
MCH: 30.4 pg (ref 26.0–34.0)
MCHC: 32.4 g/dL (ref 30.0–36.0)
MCV: 93.6 fL (ref 80.0–100.0)
MONO ABS: 0.6 10*3/uL (ref 0.1–1.0)
Monocytes Relative: 6 %
Neutro Abs: 8.8 10*3/uL — ABNORMAL HIGH (ref 1.7–7.7)
Neutrophils Relative %: 80 %
Platelets: 309 10*3/uL (ref 150–400)
RBC: 5.17 MIL/uL (ref 4.22–5.81)
RDW: 12.6 % (ref 11.5–15.5)
WBC: 10.9 10*3/uL — ABNORMAL HIGH (ref 4.0–10.5)
nRBC: 0 % (ref 0.0–0.2)

## 2018-07-25 LAB — I-STAT CG4 LACTIC ACID, ED
Lactic Acid, Venous: 5.63 mmol/L (ref 0.5–1.9)
Lactic Acid, Venous: 4.13 mmol/L (ref 0.5–1.9)

## 2018-07-25 LAB — CK: Total CK: 125 U/L (ref 49–397)

## 2018-07-25 LAB — URINALYSIS, MICROSCOPIC (REFLEX)

## 2018-07-25 LAB — CBG MONITORING, ED: GLUCOSE-CAPILLARY: 234 mg/dL — AB (ref 70–99)

## 2018-07-25 MED ORDER — LACTATED RINGERS IV BOLUS (SEPSIS)
1000.0000 mL | Freq: Once | INTRAVENOUS | Status: AC
  Administered 2018-07-25: 1000 mL via INTRAVENOUS

## 2018-07-25 MED ORDER — VANCOMYCIN HCL IN DEXTROSE 1-5 GM/200ML-% IV SOLN
1000.0000 mg | Freq: Once | INTRAVENOUS | Status: AC
  Administered 2018-07-25: 1000 mg via INTRAVENOUS
  Filled 2018-07-25: qty 200

## 2018-07-25 MED ORDER — SODIUM CHLORIDE 0.9 % IV SOLN
2.0000 g | Freq: Once | INTRAVENOUS | Status: DC
  Filled 2018-07-25: qty 2

## 2018-07-25 MED ORDER — PIPERACILLIN-TAZOBACTAM 3.375 G IVPB 30 MIN
3.3750 g | Freq: Once | INTRAVENOUS | Status: AC
  Administered 2018-07-25: 3.375 g via INTRAVENOUS
  Filled 2018-07-25 (×2): qty 50

## 2018-07-25 MED ORDER — VANCOMYCIN HCL IN DEXTROSE 750-5 MG/150ML-% IV SOLN
750.0000 mg | Freq: Two times a day (BID) | INTRAVENOUS | Status: DC
  Filled 2018-07-25: qty 150

## 2018-07-25 MED ORDER — IOPAMIDOL (ISOVUE-370) INJECTION 76%
100.0000 mL | Freq: Once | INTRAVENOUS | Status: AC | PRN
  Administered 2018-07-25: 100 mL via INTRAVENOUS

## 2018-07-25 MED ORDER — ONDANSETRON HCL 4 MG/2ML IJ SOLN
4.0000 mg | Freq: Once | INTRAMUSCULAR | Status: AC
  Administered 2018-07-25: 4 mg via INTRAVENOUS
  Filled 2018-07-25: qty 2

## 2018-07-25 MED ORDER — LACTATED RINGERS IV BOLUS
1000.0000 mL | Freq: Once | INTRAVENOUS | Status: AC
  Administered 2018-07-25: 1000 mL via INTRAVENOUS

## 2018-07-25 MED ORDER — METRONIDAZOLE IN NACL 5-0.79 MG/ML-% IV SOLN
500.0000 mg | Freq: Three times a day (TID) | INTRAVENOUS | Status: DC
  Administered 2018-07-25 (×2): 500 mg via INTRAVENOUS
  Filled 2018-07-25 (×2): qty 100

## 2018-07-25 MED ORDER — LORAZEPAM 2 MG/ML IJ SOLN
1.0000 mg | Freq: Once | INTRAMUSCULAR | Status: AC
  Administered 2018-07-25: 1 mg via INTRAVENOUS
  Filled 2018-07-25: qty 1

## 2018-07-25 MED ORDER — MECLIZINE HCL 25 MG PO TABS
25.0000 mg | ORAL_TABLET | Freq: Once | ORAL | Status: AC
  Administered 2018-07-25: 25 mg via ORAL
  Filled 2018-07-25: qty 1

## 2018-07-25 MED ORDER — ONDANSETRON HCL 4 MG/2ML IJ SOLN
INTRAMUSCULAR | Status: AC
  Administered 2018-07-25: 4 mg via INTRAVENOUS
  Filled 2018-07-25: qty 2

## 2018-07-25 MED ORDER — LACTATED RINGERS IV BOLUS (SEPSIS)
400.0000 mL | Freq: Once | INTRAVENOUS | Status: AC
  Administered 2018-07-25: 400 mL via INTRAVENOUS
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
## 2018-07-25 NOTE — Progress Notes (Signed)
Pharmacy Antibiotic Note  Randy Waters is a 57 y.o. male admitted on 07/25/2018 with sepsis.  Pharmacy has been consulted for vancomycin dosing. Pt is afebrile and WBC is minimally elevated. Lactic acid is significantly elevated at 5.63.   Plan: Vancomycin 2gm (1gm + 1gm) x 1 then 750mg  IV Q12H F/u renal fxn, C&S, clinical status and trough at [facility] F/u continuation of zosyn or other gram negative coverage  Height: 5\' 10"  (177.8 cm) Weight: 260 lb (117.9 kg) IBW/kg (Calculated) : 73  Temp (24hrs), Avg:98.2 F (36.8 C), Min:98.2 F (36.8 C), Max:98.2 F (36.8 C)  Recent Labs  Lab 07/25/18 1643 07/25/18 1656  WBC 10.9*  --   LATICACIDVEN  --  5.63*    CrCl cannot be calculated (Patient's most recent lab result is older than the maximum 21 days allowed.).    Allergies  Allergen Reactions  . Oxycodone-Acetaminophen Nausea And Vomiting    Antimicrobials this admission: Vanc 11/28>> Flagyl 11/28>> Zosyn x 1 11/28  Dose adjustments this admission: N/A  Microbiology results: Pending  Thank you for allowing pharmacy to be a part of this patient's care.  Sujey Gundry, Rande Lawman 07/25/2018 5:05 PM

## 2018-07-25 NOTE — ED Notes (Signed)
Patient transported to X-ray 

## 2018-07-25 NOTE — ED Notes (Signed)
Pt awake and alert, oriented x 4. C/o mild headache and remains dizzy with laying or moving head.

## 2018-07-25 NOTE — ED Notes (Signed)
Patient transported to CT 
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
## 2018-07-25 NOTE — ED Triage Notes (Signed)
Fever, dizziness and vomiting x 2 days.

## 2018-07-26 ENCOUNTER — Observation Stay (HOSPITAL_BASED_OUTPATIENT_CLINIC_OR_DEPARTMENT_OTHER): Payer: Managed Care, Other (non HMO)

## 2018-07-26 DIAGNOSIS — N182 Chronic kidney disease, stage 2 (mild): Secondary | ICD-10-CM | POA: Diagnosis not present

## 2018-07-26 DIAGNOSIS — K219 Gastro-esophageal reflux disease without esophagitis: Secondary | ICD-10-CM | POA: Diagnosis present

## 2018-07-26 DIAGNOSIS — R42 Dizziness and giddiness: Principal | ICD-10-CM

## 2018-07-26 DIAGNOSIS — E669 Obesity, unspecified: Secondary | ICD-10-CM

## 2018-07-26 DIAGNOSIS — I10 Essential (primary) hypertension: Secondary | ICD-10-CM | POA: Diagnosis not present

## 2018-07-26 DIAGNOSIS — I639 Cerebral infarction, unspecified: Secondary | ICD-10-CM

## 2018-07-26 DIAGNOSIS — E785 Hyperlipidemia, unspecified: Secondary | ICD-10-CM | POA: Diagnosis not present

## 2018-07-26 DIAGNOSIS — R112 Nausea with vomiting, unspecified: Secondary | ICD-10-CM

## 2018-07-26 DIAGNOSIS — W19XXXA Unspecified fall, initial encounter: Secondary | ICD-10-CM | POA: Diagnosis present

## 2018-07-26 DIAGNOSIS — E872 Acidosis, unspecified: Secondary | ICD-10-CM | POA: Diagnosis present

## 2018-07-26 DIAGNOSIS — I5032 Chronic diastolic (congestive) heart failure: Secondary | ICD-10-CM

## 2018-07-26 DIAGNOSIS — E1169 Type 2 diabetes mellitus with other specified complication: Secondary | ICD-10-CM

## 2018-07-26 LAB — BASIC METABOLIC PANEL
ANION GAP: 8 (ref 5–15)
BUN: 11 mg/dL (ref 6–20)
CO2: 25 mmol/L (ref 22–32)
Calcium: 9.4 mg/dL (ref 8.9–10.3)
Chloride: 105 mmol/L (ref 98–111)
Creatinine, Ser: 1.11 mg/dL (ref 0.61–1.24)
GFR calc Af Amer: >60 mL/min (ref >60)
GFR calc non Af Amer: >60 mL/min (ref >60)
Glucose, Bld: 141 mg/dL — ABNORMAL HIGH (ref 70–99)
Potassium: 4 mmol/L (ref 3.5–5.1)
Sodium: 138 mmol/L (ref 135–145)

## 2018-07-26 LAB — HEMOGLOBIN A1C
Hgb A1c MFr Bld: 7.9 % — ABNORMAL HIGH (ref 4.8–5.6)
MEAN PLASMA GLUCOSE: 180.03 mg/dL

## 2018-07-26 LAB — CBC
HCT: 41.8 % (ref 39.0–52.0)
Hemoglobin: 13.8 g/dL (ref 13.0–17.0)
MCH: 30.8 pg (ref 26.0–34.0)
MCHC: 33 g/dL (ref 30.0–36.0)
MCV: 93.3 fL (ref 80.0–100.0)
Platelets: 237 10*3/uL (ref 150–400)
RBC: 4.48 MIL/uL (ref 4.22–5.81)
RDW: 12.6 % (ref 11.5–15.5)
WBC: 9 10*3/uL (ref 4.0–10.5)
nRBC: 0 % (ref 0.0–0.2)

## 2018-07-26 LAB — ECHOCARDIOGRAM COMPLETE
Height: 70 in
WEIGHTICAEL: 4160 [oz_av]

## 2018-07-26 LAB — GLUCOSE, CAPILLARY
Glucose-Capillary: 135 mg/dL — ABNORMAL HIGH (ref 70–99)
Glucose-Capillary: 138 mg/dL — ABNORMAL HIGH (ref 70–99)
Glucose-Capillary: 128 mg/dL — ABNORMAL HIGH (ref 70–99)

## 2018-07-26 LAB — LACTIC ACID, PLASMA
LACTIC ACID, VENOUS: 2.2 mmol/L — AB (ref 0.5–1.9)
Lactic Acid, Venous: 1.6 mmol/L (ref 0.5–1.9)

## 2018-07-26 LAB — PROTIME-INR
INR: 1.04
Prothrombin Time: 13.5 seconds (ref 11.4–15.2)

## 2018-07-26 LAB — INFLUENZA PANEL BY PCR (TYPE A & B)
Influenza A By PCR: NEGATIVE
Influenza B By PCR: NEGATIVE

## 2018-07-26 LAB — PROCALCITONIN: Procalcitonin: <0.1 ng/mL

## 2018-07-26 LAB — HIV ANTIBODY (ROUTINE TESTING W REFLEX): HIV Screen 4th Generation wRfx: NONREACTIVE

## 2018-07-26 MED ORDER — SALINE SPRAY 0.65 % NA SOLN
1.0000 | NASAL | Status: DC | PRN
  Administered 2018-07-26: 1 via NASAL
  Filled 2018-07-26: qty 44

## 2018-07-26 MED ORDER — ONDANSETRON HCL 4 MG PO TABS
4.0000 mg | ORAL_TABLET | Freq: Four times a day (QID) | ORAL | Status: DC | PRN
  Administered 2018-07-27: 4 mg via ORAL
  Filled 2018-07-26: qty 1

## 2018-07-26 MED ORDER — FLUTICASONE PROPIONATE 50 MCG/ACT NA SUSP
2.0000 | Freq: Every day | NASAL | Status: DC
  Administered 2018-07-26 – 2018-07-29 (×4): 2 via NASAL
  Filled 2018-07-26: qty 16

## 2018-07-26 MED ORDER — HEPARIN SODIUM (PORCINE) 5000 UNIT/ML IJ SOLN
5000.0000 [IU] | Freq: Three times a day (TID) | INTRAMUSCULAR | Status: DC
  Administered 2018-07-26 – 2018-07-27 (×4): 5000 [IU] via SUBCUTANEOUS
  Filled 2018-07-26 (×4): qty 1

## 2018-07-26 MED ORDER — ALLOPURINOL 300 MG PO TABS
300.0000 mg | ORAL_TABLET | Freq: Every day | ORAL | Status: DC
  Administered 2018-07-26 – 2018-07-29 (×4): 300 mg via ORAL
  Filled 2018-07-26 (×4): qty 1

## 2018-07-26 MED ORDER — LEVALBUTEROL HCL 1.25 MG/0.5ML IN NEBU: 1.2500 mg | INHALATION_SOLUTION | Freq: Four times a day (QID) | RESPIRATORY_TRACT | Status: DC

## 2018-07-26 MED ORDER — ONDANSETRON HCL 4 MG/2ML IJ SOLN: 4.0000 mg | Freq: Four times a day (QID) | INTRAMUSCULAR | Status: DC | PRN

## 2018-07-26 MED ORDER — PHENOL 1.4 % MT LIQD
1.0000 | OROMUCOSAL | Status: DC | PRN
  Administered 2018-07-26: 1 via OROMUCOSAL
  Filled 2018-07-26: qty 177

## 2018-07-26 MED ORDER — ACETAMINOPHEN 325 MG PO TABS
650.0000 mg | ORAL_TABLET | Freq: Four times a day (QID) | ORAL | Status: DC | PRN
  Administered 2018-07-26 – 2018-07-28 (×6): 650 mg via ORAL
  Filled 2018-07-26 (×6): qty 2

## 2018-07-26 MED ORDER — ZOLPIDEM TARTRATE 5 MG PO TABS
5.0000 mg | ORAL_TABLET | Freq: Every evening | ORAL | Status: DC | PRN
  Administered 2018-07-26 – 2018-07-29 (×4): 5 mg via ORAL
  Filled 2018-07-26 (×4): qty 1

## 2018-07-26 MED ORDER — CLOPIDOGREL BISULFATE 75 MG PO TABS
75.0000 mg | ORAL_TABLET | Freq: Every day | ORAL | Status: DC
  Administered 2018-07-27 – 2018-07-29 (×3): 75 mg via ORAL
  Filled 2018-07-26 (×3): qty 1

## 2018-07-26 MED ORDER — ASPIRIN 325 MG PO TABS
325.0000 mg | ORAL_TABLET | Freq: Every day | ORAL | Status: DC
  Administered 2018-07-26: 325 mg via ORAL
  Filled 2018-07-26: qty 1

## 2018-07-26 MED ORDER — HYDRALAZINE HCL 20 MG/ML IJ SOLN: 5.0000 mg | INTRAMUSCULAR | Status: DC | PRN

## 2018-07-26 MED ORDER — ACETAMINOPHEN 650 MG RE SUPP: 650.0000 mg | Freq: Four times a day (QID) | RECTAL | Status: DC | PRN

## 2018-07-26 MED ORDER — METHOCARBAMOL 500 MG PO TABS
750.0000 mg | ORAL_TABLET | Freq: Four times a day (QID) | ORAL | Status: DC | PRN
  Administered 2018-07-26 – 2018-07-28 (×5): 750 mg via ORAL
  Filled 2018-07-26 (×5): qty 2

## 2018-07-26 MED ORDER — ASPIRIN EC 81 MG PO TBEC
81.0000 mg | DELAYED_RELEASE_TABLET | Freq: Every day | ORAL | Status: DC
  Administered 2018-07-27 – 2018-07-29 (×3): 81 mg via ORAL
  Filled 2018-07-26 (×3): qty 1

## 2018-07-26 MED ORDER — SODIUM CHLORIDE 0.9 % IV SOLN
INTRAVENOUS | Status: DC
  Administered 2018-07-26: 02:00:00 via INTRAVENOUS

## 2018-07-26 MED ORDER — MECLIZINE HCL 25 MG PO TABS
25.0000 mg | ORAL_TABLET | Freq: Three times a day (TID) | ORAL | Status: DC | PRN
  Administered 2018-07-26 – 2018-07-28 (×3): 25 mg via ORAL
  Filled 2018-07-26 (×3): qty 1

## 2018-07-26 MED ORDER — PIPERACILLIN-TAZOBACTAM 3.375 G IVPB
3.3750 g | Freq: Three times a day (TID) | INTRAVENOUS | Status: DC
  Administered 2018-07-26: 3.375 g via INTRAVENOUS
  Filled 2018-07-26 (×2): qty 50

## 2018-07-26 MED ORDER — GUAIFENESIN-DM 100-10 MG/5ML PO SYRP: 5.0000 mL | ORAL_SOLUTION | ORAL | Status: DC | PRN

## 2018-07-26 MED ORDER — LEVALBUTEROL HCL 1.25 MG/0.5ML IN NEBU: 1.2500 mg | INHALATION_SOLUTION | Freq: Four times a day (QID) | RESPIRATORY_TRACT | Status: DC | PRN

## 2018-07-26 MED ORDER — ATORVASTATIN CALCIUM 40 MG PO TABS
40.0000 mg | ORAL_TABLET | Freq: Every day | ORAL | Status: DC
  Administered 2018-07-26 – 2018-07-28 (×3): 40 mg via ORAL
  Filled 2018-07-26 (×3): qty 1

## 2018-07-26 MED ORDER — INSULIN ASPART 100 UNIT/ML ~~LOC~~ SOLN
0.0000 [IU] | Freq: Three times a day (TID) | SUBCUTANEOUS | Status: DC
  Administered 2018-07-27 (×2): 2 [IU] via SUBCUTANEOUS
  Administered 2018-07-28: 1 [IU] via SUBCUTANEOUS
  Administered 2018-07-28: 3 [IU] via SUBCUTANEOUS
  Administered 2018-07-28: 1 [IU] via SUBCUTANEOUS
  Administered 2018-07-29: 3 [IU] via SUBCUTANEOUS

## 2018-07-26 MED ORDER — LACTATED RINGERS IV SOLN
INTRAVENOUS | Status: AC
  Administered 2018-07-26 – 2018-07-27 (×3): via INTRAVENOUS

## 2018-07-26 MED ORDER — INSULIN ASPART 100 UNIT/ML ~~LOC~~ SOLN: 0.0000 [IU] | Freq: Every day | SUBCUTANEOUS | Status: DC

## 2018-07-26 MED ORDER — BUTALBITAL-APAP-CAFFEINE 50-325-40 MG PO TABS
1.0000 | ORAL_TABLET | Freq: Once | ORAL | Status: AC | PRN
  Administered 2018-07-26: 1 via ORAL
  Filled 2018-07-26: qty 1

## 2018-07-26 MED ORDER — PANTOPRAZOLE SODIUM 40 MG PO TBEC
40.0000 mg | DELAYED_RELEASE_TABLET | Freq: Every day | ORAL | Status: DC
  Administered 2018-07-26 – 2018-07-29 (×4): 40 mg via ORAL
  Filled 2018-07-26 (×4): qty 1
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
## 2018-07-26 NOTE — Progress Notes (Signed)
SLP Cancellation Note  Patient Details Name: Randy Waters MRN: [id number] DOB: 07-13-1961   Cancelled treatment:       Reason Eval/Treat Not Completed: Other (comment) Pt passed RN stroke swallow screen, therefore SLP swallow eval not indicated. Spoke with RN, who will order pt's diet. SLP to sign off.   Juan Quam Laurice 07/26/2018, 2:20 PM
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
## 2018-07-26 NOTE — Progress Notes (Signed)
  Echocardiogram 2D Echocardiogram has been performed.  Dhruva Orndoff G Maeleigh Buschman 07/26/2018, 12:55 PM

## 2018-07-26 NOTE — H&P (Addendum)
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 cc/h of normal saline. -prn Zofran for nausea vomiting  Fall: Secondary to vertigo.  - PT/OT  Lactic acid acidosis: Lactic acid 4.13.  Etiology is not clear.  Patient is still taking metformin for diabetes, which may have contributed partially.  Other differential diagnosis include starvation and sepsis.  Patient reports subjective fever.  He has mild leukocytosis and tachycardia, but does not meet criteria for sepsis.  Cannot completely rule out possibility of infection at this moment, but source of infection is not clear. -ABx: Patient was started with vancomycin, Zosyn and Flagyl in ED.  Will continue vancomycin and Zosyn and f/u procalcitonin level. -Follow-up of blood culture, urine culture.  If negative, may discontinue antibiotics. -Check flu PCR -will get Procalcitonin and trend lactic acid levels per sepsis protocol. -IVF: as above  Addendum: Lactic acid levels trending down to 2.2, procalcitonin level is less than 0.1 -will d/c Abx now. -f/u Bx and Ux    Gout: Patient still taking  allopurinol and as needed indomethacin at home.  Currently patient is not taking indomethacin -Allopurinol  Type II diabetes mellitus with renal manifestations ([place]): Last A1c 7.8 on 06/26/18, poorly controled. Patient is taking metformin, Actos, glipizide at home -SSI  HTN: Blood pressure 112/69 -hold lisinopril due to worsening renal function -IV hydralazine prn  GERD: -Protonix  CKD (chronic kidney disease), stage II: Slightly worsening.  Baseline creatinine 1.0.  His creatinine is 1.20, BUN 17. -Follow-up renal function by BMP -Hold lisinopril   DVT  ppx: SQ Heparin ( not using Lovenos in case pt need LP) Code Status: Full code Family Communication: None at bed side.   Disposition Plan:  Anticipate discharge back to previous home environment Consults called:  none Admission status: Obs / tele      Date of Service 07/26/2018    Ivor Costa [facility] Pager [phone]  If 7PM-7AM, please contact night-coverage [email or web address] Password [email or web address] 07/26/2018, 2:53 AM

## 2018-07-26 NOTE — Progress Notes (Signed)
0348: Lab called with critical lactic result of 2.2. Previous result 4.13. Next  re-check scheduled for 0445.
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
## 2018-07-26 NOTE — Progress Notes (Signed)
Inpatient Rehabilitation  Per PT request, patient was screened for an IP Rehab consult.  At this time note that stroke work-up is ongoing and we are recommending a consult.  Please order if in agreement.   Gunnar Fusi, M.A., [place]

## 2018-07-26 NOTE — Progress Notes (Signed)
Pharmacy Antibiotic Note  Randy Waters is a 57 y.o. male admitted on 07/25/2018 with sepsis.  Pharmacy has been consulted for Zosyn dosing.  Plan: Zosyn 3.375g IV q8h (4-hour infusion).  Height: 5\' 10"  (177.8 cm) Weight: 260 lb (117.9 kg) IBW/kg (Calculated) : 73  Temp (24hrs), Avg:98 F (36.7 C), Min:97.8 F (36.6 C), Max:98.2 F (36.8 C)  Recent Labs  Lab 07/25/18 1643 07/25/18 1656 07/25/18 1913  WBC 10.9*  --   --   CREATININE 1.20  --   --   LATICACIDVEN  --  5.63* 4.13*    Estimated Creatinine Clearance: 87.4 mL/min (by C-G formula based on SCr of 1.2 mg/dL).    Allergies  Allergen Reactions  . Oxycodone-Acetaminophen Nausea And Vomiting      Thank you for allowing pharmacy to be a part of this patient's care.  Wynona Neat, PharmD, BCPS  07/26/2018 1:52 AM
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
## 2018-07-27 ENCOUNTER — Observation Stay (HOSPITAL_COMMUNITY): Payer: Managed Care, Other (non HMO)

## 2018-07-27 DIAGNOSIS — H9312 Tinnitus, left ear: Secondary | ICD-10-CM | POA: Diagnosis present

## 2018-07-27 DIAGNOSIS — D72829 Elevated white blood cell count, unspecified: Secondary | ICD-10-CM | POA: Diagnosis present

## 2018-07-27 DIAGNOSIS — E785 Hyperlipidemia, unspecified: Secondary | ICD-10-CM | POA: Diagnosis present

## 2018-07-27 DIAGNOSIS — Z7984 Long term (current) use of oral hypoglycemic drugs: Secondary | ICD-10-CM | POA: Diagnosis not present

## 2018-07-27 DIAGNOSIS — R262 Difficulty in walking, not elsewhere classified: Secondary | ICD-10-CM | POA: Diagnosis present

## 2018-07-27 DIAGNOSIS — E872 Acidosis: Secondary | ICD-10-CM | POA: Diagnosis present

## 2018-07-27 DIAGNOSIS — Z885 Allergy status to narcotic agent status: Secondary | ICD-10-CM | POA: Diagnosis not present

## 2018-07-27 DIAGNOSIS — N182 Chronic kidney disease, stage 2 (mild): Secondary | ICD-10-CM | POA: Diagnosis present

## 2018-07-27 DIAGNOSIS — H5500 Unspecified nystagmus: Secondary | ICD-10-CM | POA: Diagnosis present

## 2018-07-27 DIAGNOSIS — E1165 Type 2 diabetes mellitus with hyperglycemia: Secondary | ICD-10-CM | POA: Diagnosis present

## 2018-07-27 DIAGNOSIS — E1122 Type 2 diabetes mellitus with diabetic chronic kidney disease: Secondary | ICD-10-CM | POA: Diagnosis present

## 2018-07-27 DIAGNOSIS — Z6837 Body mass index (BMI) 37.0-37.9, adult: Secondary | ICD-10-CM | POA: Diagnosis not present

## 2018-07-27 DIAGNOSIS — E860 Dehydration: Secondary | ICD-10-CM | POA: Diagnosis present

## 2018-07-27 DIAGNOSIS — W19XXXA Unspecified fall, initial encounter: Secondary | ICD-10-CM | POA: Diagnosis present

## 2018-07-27 DIAGNOSIS — Z87442 Personal history of urinary calculi: Secondary | ICD-10-CM | POA: Diagnosis not present

## 2018-07-27 DIAGNOSIS — Z79899 Other long term (current) drug therapy: Secondary | ICD-10-CM | POA: Diagnosis not present

## 2018-07-27 DIAGNOSIS — R000 Tachycardia, unspecified: Secondary | ICD-10-CM | POA: Diagnosis present

## 2018-07-27 DIAGNOSIS — Z823 Family history of stroke: Secondary | ICD-10-CM | POA: Diagnosis not present

## 2018-07-27 DIAGNOSIS — K219 Gastro-esophageal reflux disease without esophagitis: Secondary | ICD-10-CM | POA: Diagnosis present

## 2018-07-27 DIAGNOSIS — E669 Obesity, unspecified: Secondary | ICD-10-CM | POA: Diagnosis present

## 2018-07-27 DIAGNOSIS — M1990 Unspecified osteoarthritis, unspecified site: Secondary | ICD-10-CM | POA: Diagnosis present

## 2018-07-27 DIAGNOSIS — M109 Gout, unspecified: Secondary | ICD-10-CM | POA: Diagnosis present

## 2018-07-27 DIAGNOSIS — I5032 Chronic diastolic (congestive) heart failure: Secondary | ICD-10-CM | POA: Diagnosis present

## 2018-07-27 DIAGNOSIS — R42 Dizziness and giddiness: Secondary | ICD-10-CM | POA: Diagnosis present

## 2018-07-27 DIAGNOSIS — I130 Hypertensive heart and chronic kidney disease with heart failure and stage 1 through stage 4 chronic kidney disease, or unspecified chronic kidney disease: Secondary | ICD-10-CM | POA: Diagnosis present

## 2018-07-27 DIAGNOSIS — I639 Cerebral infarction, unspecified: Secondary | ICD-10-CM | POA: Diagnosis present

## 2018-07-27 DIAGNOSIS — R112 Nausea with vomiting, unspecified: Secondary | ICD-10-CM | POA: Diagnosis present

## 2018-07-27 LAB — URINE CULTURE: Culture: NO GROWTH

## 2018-07-27 LAB — CBC
HEMATOCRIT: 40.1 % (ref 39.0–52.0)
HEMOGLOBIN: 13.1 g/dL (ref 13.0–17.0)
MCH: 30.8 pg (ref 26.0–34.0)
MCHC: 32.7 g/dL (ref 30.0–36.0)
MCV: 94.4 fL (ref 80.0–100.0)
Platelets: 245 10*3/uL (ref 150–400)
RBC: 4.25 MIL/uL (ref 4.22–5.81)
RDW: 12.7 % (ref 11.5–15.5)
WBC: 5.9 10*3/uL (ref 4.0–10.5)
nRBC: 0 % (ref 0.0–0.2)

## 2018-07-27 LAB — COMPREHENSIVE METABOLIC PANEL
ALT: 25 U/L (ref 0–44)
AST: 23 U/L (ref 15–41)
Albumin: 3.3 g/dL — ABNORMAL LOW (ref 3.5–5.0)
Alkaline Phosphatase: 51 U/L (ref 38–126)
Anion gap: 10 (ref 5–15)
BILIRUBIN TOTAL: 0.6 mg/dL (ref 0.3–1.2)
BUN: 9 mg/dL (ref 6–20)
CALCIUM: 8.7 mg/dL — AB (ref 8.9–10.3)
CO2: 25 mmol/L (ref 22–32)
Chloride: 106 mmol/L (ref 98–111)
Creatinine, Ser: 1.04 mg/dL (ref 0.61–1.24)
GFR calc Af Amer: >60 mL/min (ref >60)
GFR calc non Af Amer: >60 mL/min (ref >60)
GLUCOSE: 130 mg/dL — AB (ref 70–99)
Potassium: 3.5 mmol/L (ref 3.5–5.1)
Sodium: 141 mmol/L (ref 135–145)
Total Protein: 5.7 g/dL — ABNORMAL LOW (ref 6.5–8.1)

## 2018-07-27 LAB — GLUCOSE, CAPILLARY
Glucose-Capillary: 115 mg/dL — ABNORMAL HIGH (ref 70–99)
Glucose-Capillary: 162 mg/dL — ABNORMAL HIGH (ref 70–99)
Glucose-Capillary: 123 mg/dL — ABNORMAL HIGH (ref 70–99)

## 2018-07-27 LAB — MAGNESIUM: Magnesium: 1.6 mg/dL — ABNORMAL LOW (ref 1.7–2.4)

## 2018-07-27 LAB — LIPID PANEL
Cholesterol: 127 mg/dL (ref 0–200)
HDL: 21 mg/dL — ABNORMAL LOW (ref >40)
LDL CALC: 58 mg/dL (ref 0–99)
TRIGLYCERIDES: 238 mg/dL — AB (ref <150)
Total CHOL/HDL Ratio: 6 RATIO
VLDL: 48 mg/dL — ABNORMAL HIGH (ref 0–40)

## 2018-07-27 MED ORDER — ENOXAPARIN SODIUM 60 MG/0.6ML ~~LOC~~ SOLN
0.5000 mg/kg | SUBCUTANEOUS | Status: DC
  Administered 2018-07-27 – 2018-07-28 (×2): 60 mg via SUBCUTANEOUS
  Filled 2018-07-27 (×2): qty 0.6
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
## 2018-07-28 LAB — CBC
HCT: 42 % (ref 39.0–52.0)
Hemoglobin: 13.4 g/dL (ref 13.0–17.0)
MCH: 30.1 pg (ref 26.0–34.0)
MCHC: 31.9 g/dL (ref 30.0–36.0)
MCV: 94.4 fL (ref 80.0–100.0)
Platelets: 263 10*3/uL (ref 150–400)
RBC: 4.45 MIL/uL (ref 4.22–5.81)
RDW: 12.4 % (ref 11.5–15.5)
WBC: 7.6 10*3/uL (ref 4.0–10.5)
nRBC: 0 % (ref 0.0–0.2)

## 2018-07-28 LAB — BASIC METABOLIC PANEL
Anion gap: 12 (ref 5–15)
BUN: 14 mg/dL (ref 6–20)
CO2: 25 mmol/L (ref 22–32)
Calcium: 8.9 mg/dL (ref 8.9–10.3)
Chloride: 103 mmol/L (ref 98–111)
Creatinine, Ser: 1.28 mg/dL — ABNORMAL HIGH (ref 0.61–1.24)
GFR calc Af Amer: >60 mL/min (ref >60)
GFR calc non Af Amer: >60 mL/min (ref >60)
Glucose, Bld: 141 mg/dL — ABNORMAL HIGH (ref 70–99)
Potassium: 3.4 mmol/L — ABNORMAL LOW (ref 3.5–5.1)
Sodium: 140 mmol/L (ref 135–145)

## 2018-07-28 LAB — MAGNESIUM: Magnesium: 1.9 mg/dL (ref 1.7–2.4)

## 2018-07-28 LAB — GLUCOSE, CAPILLARY
GLUCOSE-CAPILLARY: 208 mg/dL — AB (ref 70–99)
GLUCOSE-CAPILLARY: 198 mg/dL — AB (ref 70–99)
Glucose-Capillary: 127 mg/dL — ABNORMAL HIGH (ref 70–99)
Glucose-Capillary: 145 mg/dL — ABNORMAL HIGH (ref 70–99)

## 2018-07-28 MED ORDER — ASPIRIN 81 MG PO TBEC: 81.0000 mg | DELAYED_RELEASE_TABLET | Freq: Every day | ORAL | 3 refills | Status: DC

## 2018-07-28 MED ORDER — ATORVASTATIN CALCIUM 40 MG PO TABS: 40.0000 mg | ORAL_TABLET | Freq: Every day | ORAL | 0 refills | Status: DC

## 2018-07-28 MED ORDER — CLOPIDOGREL BISULFATE 75 MG PO TABS: 75.0000 mg | ORAL_TABLET | Freq: Every day | ORAL | 0 refills | Status: AC
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
## 2018-07-28 NOTE — Discharge Summary (Addendum)
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 hours. Urinalysis    Component Value Date/Time   COLORURINE YELLOW 07/25/2018 1737   APPEARANCEUR CLEAR 07/25/2018 1737   LABSPEC 1.010 07/25/2018 1737   PHURINE 6.0 07/25/2018 1737   GLUCOSEU >=500 (A) 07/25/2018 1737   HGBUR SMALL (A) 07/25/2018 1737   BILIRUBINUR NEGATIVE 07/25/2018 1737   BILIRUBINUR Negative 06/26/2018 1256   KETONESUR 15 (A) 07/25/2018 1737   PROTEINUR NEGATIVE 07/25/2018 1737   UROBILINOGEN 0.2 06/26/2018 1256   UROBILINOGEN 0.2 07/27/2014 1215   NITRITE NEGATIVE 07/25/2018 1737   LEUKOCYTESUR NEGATIVE 07/25/2018 1737   Sepsis Labs Invalid input(s): PROCALCITONIN,  WBC,  LACTICIDVEN Microbiology Recent Results (from the past 240 hour(s))  Blood Culture (routine x 2)     Status: None (Preliminary  result)   Collection Time: 07/25/18  5:20 PM  Result Value Ref Range Status   Specimen Description   Final    BLOOD LEFT ANTECUBITAL Performed at [facility], [address] [place], [place] [address]    Special Requests   Final    BOTTLES DRAWN AEROBIC AND ANAEROBIC Blood Culture results may  not be optimal due to an excessive volume of blood received in culture bottles Performed at [facility], [address] [place], [place] [address]    Culture   Final    NO GROWTH 2 DAYS Performed at [facility], [place] [address] [place], [place] [address]    Report Status PENDING  Incomplete  Blood Culture (routine x 2)     Status: None (Preliminary result)   Collection Time: 07/25/18  5:25 PM  Result Value Ref Range Status   Specimen Description   Final    BLOOD RIGHT ANTECUBITAL Performed at [facility], [address] [place], [place] [address]    Special Requests   Final    BOTTLES DRAWN AEROBIC AND ANAEROBIC Blood Culture adequate volume Performed at [facility], [address] [place], [place] [address]    Culture   Final    NO GROWTH 2 DAYS Performed at [facility], [place] [address] [place], [place] [address]    Report Status PENDING  Incomplete  Urine culture     Status: None   Collection Time: 07/25/18  5:37 PM  Result Value Ref Range Status   Specimen Description   Final    URINE, RANDOM Performed at [facility], [address] [place], [place] [address]    Special Requests   Final    NONE Performed at [facility], [address] [place], [place] [address]    Culture   Final    NO GROWTH Performed at [facility], [place] [address] [place], [place] [address]    Report Status 07/27/2018 FINAL  Final     Time coordinating discharge:  I have spent 35 minutes face to face with the patient and on the ward discussing the patients care, assessment, plan and disposition with other  care givers. >50% of the time was devoted counseling the patient about the risks and benefits of treatment/Discharge disposition and coordinating care.   SIGNED:   Damita Lack, MD  [facility] 07/28/2018, 8:29 AM Pager   If 7PM-7AM, please contact night-coverage [email or web address] Password [email or web address]
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
## 2018-07-29 DIAGNOSIS — I639 Cerebral infarction, unspecified: Secondary | ICD-10-CM

## 2018-07-29 DIAGNOSIS — R1110 Vomiting, unspecified: Secondary | ICD-10-CM

## 2018-07-29 DIAGNOSIS — E669 Obesity, unspecified: Secondary | ICD-10-CM

## 2018-07-29 DIAGNOSIS — I5032 Chronic diastolic (congestive) heart failure: Secondary | ICD-10-CM

## 2018-07-29 DIAGNOSIS — E1169 Type 2 diabetes mellitus with other specified complication: Secondary | ICD-10-CM

## 2018-07-29 LAB — GLUCOSE, CAPILLARY
Glucose-Capillary: 154 mg/dL — ABNORMAL HIGH (ref 70–99)
Glucose-Capillary: 120 mg/dL — ABNORMAL HIGH (ref 70–99)
Glucose-Capillary: 223 mg/dL — ABNORMAL HIGH (ref 70–99)

## 2018-07-29 MED ORDER — ONDANSETRON HCL 4 MG PO TABS: 4.0000 mg | ORAL_TABLET | Freq: Every day | ORAL | 0 refills | Status: DC | PRN

## 2018-07-29 MED ORDER — ONDANSETRON 4 MG PO TBDP
4.0000 mg | ORAL_TABLET | Freq: Four times a day (QID) | ORAL | Status: DC | PRN
  Administered 2018-07-29: 4 mg via ORAL
  Filled 2018-07-29: qty 1
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
## 2018-07-29 NOTE — Progress Notes (Signed)
Inpatient Rehabilitation Admissions Coordinator  Patient not in need of an inpt rehab admit at current level. Outpatient PT and OT recommended. I have notified RN CM, Deborah.We will sign off at this time.  Danne Baxter, RN, MSN Rehab Admissions Coordinator [phone] 07/29/2018 1:06 PM

## 2018-07-29 NOTE — Progress Notes (Signed)
Patient was stable at discharge. I removed their IV. We reviewed the discharge education. Patient/Family verbalized understanding and had no further questions. Patient left with prescription/s in hand.  

## 2018-07-29 NOTE — Care Management Note (Signed)
Case Management Note  Patient Details  Name: Randy Waters MRN: [id number] Date of Birth: 02/04/1961  Subjective/Objective:   From home, for dc today, per pt/ot eval rec outpatient pt/ot. NCM made referral thur epic, patient will also need a rolling walker, referral given to [facility] with [facility] ,will bring up to room.                 Action/Plan: DC home when ready.   Expected Discharge Date:  07/28/18               Expected Discharge Plan:  OP Rehab  In-House Referral:     Discharge planning Services  CM Consult  Post Acute Care Choice:  Durable Medical Equipment Choice offered to:  Patient  DME Arranged:  Gilford Rile rolling DME Agency:  [place]:    [place]:     Status of Service:  Completed, signed off  If discussed at [place] of Stay Meetings, dates discussed:    Additional Comments:  Zenon Mayo, RN 07/29/2018, 11:53 AM
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
## 2018-07-30 LAB — CULTURE, BLOOD (ROUTINE X 2)
Culture: NO GROWTH
Culture: NO GROWTH
Special Requests: ADEQUATE

## 2018-08-01 ENCOUNTER — Telehealth: Payer: Self-pay | Admitting: *Deleted

## 2018-08-01 NOTE — Telephone Encounter (Signed)
I am not comfortable with referring him until I can see him myself. He is scheduled to see [place] tomorrow and we will go from there.

## 2018-08-01 NOTE — Telephone Encounter (Signed)
Transition Care Management Follow-up Telephone Call  FYI - Patient request a referral to a PT specialist for an Epley maneuver.  Dr Rondell Reams - [facility] or a local doctor who specializes in Epley maneuver     Admit date: 07/25/2018 Discharge date: 07/28/2018  Admitted From: Home Disposition: CIR  FOLLOWING DAY PATIENT DOING BETTER THEREFORE GOING TO [place].   Recommendations for Outpatient Follow-up:  1. Follow up with PCP in 1-2 weeks 2. Please obtain BMP/CBC in one week your next doctors visit.  3. Follow-up with neurology in 3-4 weeks 4. Aspirin 81 mg daily along with Plavix 75 mg daily for 3 weeks followed by aspirin alone   How have you been since you were released from the hospital? Doing better some dizziness. Getting around better has some nausea.   Do you understand why you were in the hospital? Yes - possible stroke/vertigo   Do you understand the discharge instructions? yes   Where were you discharged to? home   Items Reviewed:  Medications reviewed: yes  Allergies reviewed: yes  Dietary changes reviewed: yes  Referrals reviewed: yes   Functional Questionnaire:  Activities of Daily Living (ADLs):   He states they are independent in the following: uses walker States they require assistance with the following: ambulation  Uses walker  Any transportation issues/concerns?: no  Any patient concerns? Yes would like a referral to Dr Rondell Reams [facility] or a local doctor who specializes in Epley maneuver    Confirmed importance and date/time of follow-up visits scheduled yes  Provider Appointment booked with Dr Sarajane Jews 08/02/2018 at 11 am  Confirmed with patient if condition begins to worsen call PCP or go to the ER.  Patient was given the office number and encouraged to call back with question or concerns.  : yes

## 2018-08-02 ENCOUNTER — Encounter: Payer: Self-pay | Admitting: Family Medicine

## 2018-08-02 ENCOUNTER — Ambulatory Visit: Payer: Managed Care, Other (non HMO) | Admitting: Family Medicine

## 2018-08-02 VITALS — BP 142/84 | HR 113 | Temp 98.8°F | Wt 262.2 lb

## 2018-08-02 DIAGNOSIS — I639 Cerebral infarction, unspecified: Secondary | ICD-10-CM

## 2018-08-02 DIAGNOSIS — I10 Essential (primary) hypertension: Secondary | ICD-10-CM | POA: Diagnosis not present

## 2018-08-02 DIAGNOSIS — E1129 Type 2 diabetes mellitus with other diabetic kidney complication: Secondary | ICD-10-CM | POA: Diagnosis not present

## 2018-08-02 DIAGNOSIS — R42 Dizziness and giddiness: Secondary | ICD-10-CM

## 2018-08-02 LAB — BASIC METABOLIC PANEL
BUN: 16 mg/dL (ref 6–23)
CO2: 27 mEq/L (ref 19–32)
Calcium: 10.5 mg/dL (ref 8.4–10.5)
Chloride: 103 mEq/L (ref 96–112)
Creatinine, Ser: 1.1 mg/dL (ref 0.40–1.50)
GFR: 73.08 mL/min (ref >60.00)
Glucose, Bld: 117 mg/dL — ABNORMAL HIGH (ref 70–99)
POTASSIUM: 4.8 meq/L (ref 3.5–5.1)
Sodium: 139 mEq/L (ref 135–145)
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
## 2018-08-08 ENCOUNTER — Encounter: Payer: Self-pay | Admitting: *Deleted

## 2018-08-09 ENCOUNTER — Encounter: Payer: Self-pay | Admitting: Physical Therapy

## 2018-08-09 ENCOUNTER — Ambulatory Visit: Payer: Managed Care, Other (non HMO) | Attending: Internal Medicine | Admitting: Physical Therapy

## 2018-08-09 ENCOUNTER — Other Ambulatory Visit: Payer: Self-pay

## 2018-08-09 DIAGNOSIS — R2681 Unsteadiness on feet: Secondary | ICD-10-CM | POA: Diagnosis present

## 2018-08-09 DIAGNOSIS — R262 Difficulty in walking, not elsewhere classified: Secondary | ICD-10-CM | POA: Diagnosis present

## 2018-08-09 DIAGNOSIS — R42 Dizziness and giddiness: Secondary | ICD-10-CM | POA: Diagnosis present
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
## 2018-08-09 NOTE — Patient Instructions (Signed)
Compensatory Strategies: Corrective Saccades    1. Holding two stationary targets placed __12-14__ inches apart, move eyes to target, keep head still. 2. Then move head in direction of target while eyes remain on target. 3/4. Repeat in opposite direction. Perform sitting. Repeat sequence __10__ times per session. Do __3__ sessions per day.  Copyright  VHI. All rights reserved.

## 2018-08-14 ENCOUNTER — Ambulatory Visit: Payer: Managed Care, Other (non HMO) | Admitting: Physical Therapy

## 2018-08-14 ENCOUNTER — Encounter: Payer: Self-pay | Admitting: Physical Therapy

## 2018-08-14 DIAGNOSIS — R42 Dizziness and giddiness: Secondary | ICD-10-CM | POA: Diagnosis not present

## 2018-08-14 DIAGNOSIS — R262 Difficulty in walking, not elsewhere classified: Secondary | ICD-10-CM

## 2018-08-14 DIAGNOSIS — R2681 Unsteadiness on feet: Secondary | ICD-10-CM
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
## 2018-08-16 ENCOUNTER — Ambulatory Visit: Payer: Managed Care, Other (non HMO) | Admitting: Physical Therapy

## 2018-08-16 ENCOUNTER — Encounter: Payer: Self-pay | Admitting: Physical Therapy

## 2018-08-16 DIAGNOSIS — R2681 Unsteadiness on feet: Secondary | ICD-10-CM

## 2018-08-16 DIAGNOSIS — R42 Dizziness and giddiness: Secondary | ICD-10-CM
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
## 2018-08-16 NOTE — Patient Instructions (Addendum)
  Access Code: [email or web address]  URL: [email or web address]  Date: 08/14/2018  Prepared by: Barry Brunner   Exercises  Seated Gaze Stabilization with Head Nod - 3-5 reps - 1 sets - 30 hold - 1x daily - 7x weekly  Seated Gaze Stabilization with Head Rotation - 3-5 reps - 1 sets - 30 hold - 1x daily - 7x weekly  Romberg Stance with Eyes Closed - 3-5 reps - 1 sets - 30 hold - 1x daily - 7x weekly  Romberg Stance with Head Rotation - 3 reps - 1 sets - 30 hold - 1x daily - 7x weekly  Tandem Stance - 3 reps - 1 sets - 30 hold - 1x daily - 7x weekly    Gaze Stabilization: Tip Card  1.Target must remain in focus, not blurry, and appear stationary while head is in motion. 2.Perform exercises with small head movements (30 to either side of midline). 3.Increase speed of head motion so long as target is in focus. 4.If you wear eyeglasses, be sure you can see target through lens (therapist will give specific instructions for bifocal / progressive lenses). 5.These exercises should provoke dizziness or nausea. Work through these symptoms. If too dizzy, slow head movement slightly. (Too dizzy is moderately dizzy or 5 out of 10 with a 10 meaning you're so dizzy you are falling).  Overall the symptoms of dizziness or nausea should resolve within 30 minutes of completing all repetitions. 6.Exercises demand concentration; avoid distractions. Begin with the target on a simple background unless instructed otherwise.      Special Instructions:  If symptoms are lasting longer than 30 minutes, modify your exercises by:    >decreasing the # of times you complete each activity >ensuring your symptoms return to baseline before moving onto the next exercise >dividing up exercises so you do not do them all in one session, but multiple short sessions throughout the day >doing them once a day until symptoms improve

## 2018-08-19 ENCOUNTER — Ambulatory Visit: Payer: Managed Care, Other (non HMO) | Admitting: Physical Therapy

## 2018-08-19 ENCOUNTER — Encounter: Payer: Self-pay | Admitting: Physical Therapy

## 2018-08-19 DIAGNOSIS — R42 Dizziness and giddiness: Secondary | ICD-10-CM | POA: Diagnosis not present

## 2018-08-19 DIAGNOSIS — R2681 Unsteadiness on feet: Secondary | ICD-10-CM
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
## 2018-08-19 NOTE — Patient Instructions (Signed)
Access Code: [email or web address]  URL: [email or web address]  Date: 08/19/2018  Prepared by: Barry Brunner   Exercises  Seated Gaze Stabilization with Head Nod - 3-5 reps - 1 sets - 30 hold - 1x daily - 7x weekly  Seated Gaze Stabilization with Head Rotation - 3-5 reps - 1 sets - 30 hold - 1x daily - 7x weekly  Romberg Stance with Eyes Closed - 3-5 reps - 1 sets - 30 hold - 1x daily - 7x weekly  Tandem Stance - 3 reps - 1 sets - 30 hold - 1x daily - 7x weekly  Foam-feet together, EC, head turns - 10 reps - 1 sets - - hold - 1x daily - 7x weekly  Foam-half tandem, eyes closed - 3 reps - 1 sets - 30 seconds hold - 1x daily - 7x weekly

## 2018-08-22 ENCOUNTER — Ambulatory Visit: Payer: Managed Care, Other (non HMO)

## 2018-08-22 DIAGNOSIS — R42 Dizziness and giddiness: Secondary | ICD-10-CM

## 2018-08-22 DIAGNOSIS — R2681 Unsteadiness on feet: Secondary | ICD-10-CM
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
## 2018-08-29 ENCOUNTER — Encounter: Payer: Managed Care, Other (non HMO) | Admitting: Physical Therapy

## 2018-08-30 ENCOUNTER — Ambulatory Visit: Payer: Managed Care, Other (non HMO) | Attending: Internal Medicine | Admitting: Physical Therapy

## 2018-08-30 ENCOUNTER — Encounter: Payer: Self-pay | Admitting: Physical Therapy

## 2018-08-30 DIAGNOSIS — R2681 Unsteadiness on feet: Secondary | ICD-10-CM | POA: Diagnosis present

## 2018-08-30 DIAGNOSIS — R262 Difficulty in walking, not elsewhere classified: Secondary | ICD-10-CM | POA: Diagnosis present

## 2018-08-30 DIAGNOSIS — R42 Dizziness and giddiness: Secondary | ICD-10-CM | POA: Insufficient documentation
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
## 2018-08-30 NOTE — Patient Instructions (Signed)
  Make sure to stop your exercise when target looks different. AND allow symptoms to settle between repetitions.   Choose a single set of readers to use and don't keep trading glasses.  IF needed, take a 60 second break (with eyes closed) between using tablet and farther distance (TV)  Use sinus medicine to see if that helps

## 2018-09-03 ENCOUNTER — Telehealth: Payer: Self-pay | Admitting: Family Medicine

## 2018-09-03 NOTE — Telephone Encounter (Signed)
Copied from [place] [phone]. Topic: General - Other >> Sep 03, 2018 10:42 AM Lennox Solders wrote: Reason for CRM: pt saw dr fry on 08-02-18 and gave dr fry FMLA paperwork. Pt is calling to check on the status of FMLA. Please call patient also fax to [place] [phone]

## 2018-09-03 NOTE — Telephone Encounter (Signed)
Dr. Sarajane Jews do you recall FMLA papers on this pt?  I do not see these scanned into the chart and do not have any papers on him.  Thanks

## 2018-09-04 NOTE — Telephone Encounter (Signed)
Called and spoke with pt and he stated that the forms never were received by his office.  He is requested to take the full 12 weeks out since he is still dizzy.  He wanted to see if Dr. Sarajane Jews would extend his return to work date to Feb 24th.  Dr. Sarajane Jews please advise. These forms have been placed in the red folder to be signed.

## 2018-09-04 NOTE — Telephone Encounter (Signed)
This has already been filled out and turned in. It was scanned into his chart on 08-02-18

## 2018-09-05 ENCOUNTER — Ambulatory Visit: Payer: Managed Care, Other (non HMO)

## 2018-09-05 DIAGNOSIS — R42 Dizziness and giddiness: Secondary | ICD-10-CM | POA: Diagnosis not present

## 2018-09-05 DIAGNOSIS — R2681 Unsteadiness on feet: Secondary | ICD-10-CM

## 2018-09-05 DIAGNOSIS — R262 Difficulty in walking, not elsewhere classified: Secondary | ICD-10-CM

## 2018-09-05 NOTE — Telephone Encounter (Signed)
Patient checking on the status of message below, informed patient of PCP notes. Patient would like a follow up call when forms are re faxed.

## 2018-09-05 NOTE — Telephone Encounter (Signed)
I changed the back to work date so this can be refaxed
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
## 2018-09-05 NOTE — Patient Instructions (Signed)
Access Code: [email or web address]  URL: [email or web address]  Date: 09/05/2018  Prepared by: Geoffry Paradise   Exercises  Tandem Stance - 3 reps - 1 sets - 30 hold - 1x daily - 7x weekly  Foam-half tandem, eyes closed - 3 reps - 1 sets - 30 seconds hold - 1x daily - 7x weekly  Romberg Stance with Head Nods on Foam Pad - 10 reps - 1 sets - 1x daily - 7x weekly  Standing Gaze Stabilization with Head Rotation - 2-3 reps - 2 sets - 1x daily - 7x weekly  Standing Gaze Stabilization with Head Nod - 2-3 reps - 2 sets - 1x daily - 7x weekly
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
## 2018-09-06 ENCOUNTER — Ambulatory Visit: Payer: Managed Care, Other (non HMO) | Admitting: Adult Health

## 2018-09-06 ENCOUNTER — Encounter: Payer: Self-pay | Admitting: Adult Health

## 2018-09-06 VITALS — BP 119/93 | HR 106 | Ht 71.0 in | Wt 267.4 lb

## 2018-09-06 DIAGNOSIS — R2990 Unspecified symptoms and signs involving the nervous system: Secondary | ICD-10-CM | POA: Diagnosis not present

## 2018-09-06 DIAGNOSIS — R42 Dizziness and giddiness: Secondary | ICD-10-CM

## 2018-09-06 MED ORDER — ATORVASTATIN CALCIUM 40 MG PO TABS: 40.0000 mg | ORAL_TABLET | Freq: Every day | ORAL | 3 refills | Status: DC

## 2018-09-06 MED ORDER — MECLIZINE HCL 25 MG PO TABS: 25.0000 mg | ORAL_TABLET | Freq: Two times a day (BID) | ORAL | 0 refills | Status: DC | PRN

## 2018-09-06 MED ORDER — ASPIRIN 81 MG PO TBEC: 81.0000 mg | DELAYED_RELEASE_TABLET | Freq: Every day | ORAL | 3 refills | Status: DC

## 2018-09-06 NOTE — Telephone Encounter (Signed)
Called and spoke with pt and advised him that the FMLA forms had been updated with the correct dates and faxed back in today.  Nothing further is needed.

## 2018-09-06 NOTE — Patient Instructions (Signed)
Continue aspirin 81 mg daily  and lipitor  for secondary stroke prevention  Continue to follow up with PCP regarding cholesterol, blood presure and diabetes management   Start meclizine 25mg  as needed twice a day  Continue physical therapy for continued dizziness/vertigo symptoms  Continue to monitor blood pressure at home  Maintain strict control of hypertension with blood pressure goal below 130/90, diabetes with hemoglobin A1c goal below 6.5% and cholesterol with LDL cholesterol (bad cholesterol) goal below 70 mg/dL. I also advised the patient to eat a healthy diet with plenty of whole grains, cereals, fruits and vegetables, exercise regularly and maintain ideal body weight.  Followup in the future with me in 3 months or call earlier if needed       Thank you for coming to see [place] at [facility]. I hope we have been able to provide you high quality care today.  You may receive a patient satisfaction survey over the next few weeks. We would appreciate your feedback and comments so that we may continue to improve ourselves and the health of our patients.

## 2018-09-12 ENCOUNTER — Encounter: Payer: Self-pay | Admitting: Physical Therapy

## 2018-09-12 ENCOUNTER — Ambulatory Visit: Payer: Managed Care, Other (non HMO) | Admitting: Physical Therapy

## 2018-09-12 DIAGNOSIS — R42 Dizziness and giddiness: Secondary | ICD-10-CM | POA: Diagnosis not present

## 2018-09-12 DIAGNOSIS — R262 Difficulty in walking, not elsewhere classified: Secondary | ICD-10-CM

## 2018-09-12 DIAGNOSIS — R2681 Unsteadiness on feet: Secondary | ICD-10-CM
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
## 2018-09-13 NOTE — Progress Notes (Signed)
I agree with the above plan 

## 2018-09-16 ENCOUNTER — Ambulatory Visit: Payer: Managed Care, Other (non HMO) | Admitting: Family Medicine

## 2018-09-16 ENCOUNTER — Encounter: Payer: Self-pay | Admitting: Family Medicine

## 2018-09-16 VITALS — BP 118/82 | HR 92 | Temp 98.9°F | Wt 262.4 lb

## 2018-09-16 DIAGNOSIS — E1129 Type 2 diabetes mellitus with other diabetic kidney complication: Secondary | ICD-10-CM | POA: Diagnosis not present

## 2018-09-16 DIAGNOSIS — R42 Dizziness and giddiness: Secondary | ICD-10-CM

## 2018-09-16 DIAGNOSIS — I639 Cerebral infarction, unspecified: Secondary | ICD-10-CM

## 2018-09-16 NOTE — Progress Notes (Signed)
   Subjective:    Patient ID: Randy Waters, male    DOB: 10-Aug-1961, 58 y.o.   MRN: [id number]  HPI Here to follow up on dizziness. He has been going to PT, and he is slowly improving day by day. He feels fine when sitting still but he gets a little dizzy when he gets up to walk around. He saw Neurology on 09-06-18 and they felt he should continue with PT and use Meclizine as needed. Randy Waters want to return to work, though he does not feel safe driving yet. 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 He feels he can work a full 40 hour week.   Review of Systems  Constitutional: Negative.   Respiratory: Negative.   Cardiovascular: Negative.   Neurological: Positive for dizziness. Negative for tremors, seizures, syncope, facial asymmetry, speech difficulty, weakness, light-headedness, numbness and headaches.       Objective:   Physical Exam Constitutional:      Appearance: Normal appearance.  Cardiovascular:     Rate and Rhythm: Normal rate and regular rhythm.     Pulses: Normal pulses.     Heart sounds: Normal heart sounds.  Pulmonary:     Effort: Pulmonary effort is normal.     Breath sounds: Normal breath sounds.  Neurological:     General: No focal deficit present.     Mental Status: He is alert and oriented to person, place, and time.     Cranial Nerves: No cranial nerve deficit.     Motor: No weakness.     Coordination: Coordination normal.     Gait: Gait normal.           Assessment & Plan:  Dizziness, with symptoms consistent with a brainstem infarct. He will continue PT. We will allow him to return to work as of 09-23-18 but he will not be allowed to drive for the time being.  Alysia Penna, MD

## 2018-09-17 LAB — HEMOGLOBIN A1C: Hgb A1c MFr Bld: 7.8 % — ABNORMAL HIGH (ref 4.6–6.5)

## 2018-09-19 ENCOUNTER — Ambulatory Visit: Payer: Managed Care, Other (non HMO)

## 2018-09-19 ENCOUNTER — Other Ambulatory Visit: Payer: Self-pay | Admitting: Family Medicine

## 2018-09-19 DIAGNOSIS — R42 Dizziness and giddiness: Secondary | ICD-10-CM | POA: Diagnosis not present

## 2018-09-19 DIAGNOSIS — R262 Difficulty in walking, not elsewhere classified: Secondary | ICD-10-CM

## 2018-09-19 DIAGNOSIS — R2681 Unsteadiness on feet: Secondary | ICD-10-CM

## 2018-09-19 MED ORDER — PIOGLITAZONE HCL 45 MG PO TABS: 45.0000 mg | ORAL_TABLET | Freq: Every day | ORAL | 3 refills | Status: DC
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
## 2018-09-19 NOTE — Patient Instructions (Addendum)
   Place paper in front of face (or string with blocks on it), focus on dot furthest away (10 inches), then look at dot closest (4 inches) to your face. Go back and forth x10 times. Perform twice a day.

## 2018-09-23 ENCOUNTER — Telehealth: Payer: Self-pay | Admitting: Family Medicine

## 2018-09-23 NOTE — Telephone Encounter (Signed)
Wrong office. Sending to right office.

## 2018-09-23 NOTE — Telephone Encounter (Signed)
Copied from [place] [id number]. Topic: Quick Communication - See Telephone Encounter >> Sep 23, 2018  4:09 PM Blase Mess A wrote: CRM for notification. See Telephone encounter for: 09/23/18.  Felica from [employer] is also requesting lancets and test strips for One Touch Verio Flex Please [employer]- [phone]

## 2018-09-23 NOTE — Telephone Encounter (Signed)
Copied from [place]. Topic: Quick Communication - See Telephone Encounter >> Sep 23, 2018  4:06 PM Blase Mess A wrote: CRM for notification. See Telephone encounter for: 09/23/18.  Randy Waters is calling [employer] regarding One Touch Verio Flex Meter asking for the script to be sent to [employer] [phone]

## 2018-09-23 NOTE — Telephone Encounter (Signed)
Wrong office, sending to the right office.

## 2018-09-24 MED ORDER — ONETOUCH VERIO FLEX SYSTEM W/DEVICE KIT: PACK | 0 refills | Status: AC

## 2018-09-24 MED ORDER — ONETOUCH ULTRASOFT LANCETS MISC: 12 refills | Status: DC

## 2018-09-24 MED ORDER — GLUCOSE BLOOD VI STRP: ORAL_STRIP | 12 refills | Status: DC

## 2018-09-24 NOTE — Telephone Encounter (Signed)
rx has been sent to the [facility] mail order per requested.

## 2018-09-24 NOTE — Telephone Encounter (Signed)
rx sent to the [facility]

## 2018-09-26 ENCOUNTER — Ambulatory Visit: Payer: Managed Care, Other (non HMO)

## 2018-09-26 DIAGNOSIS — R2681 Unsteadiness on feet: Secondary | ICD-10-CM

## 2018-09-26 DIAGNOSIS — R42 Dizziness and giddiness: Secondary | ICD-10-CM
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
## 2018-09-26 NOTE — Addendum Note (Signed)
Addended by: Rexanne Mano on: 09/26/2018 01:00 PM   Modules accepted: Orders

## 2018-09-30 ENCOUNTER — Encounter: Payer: Self-pay | Admitting: Family Medicine

## 2018-09-30 ENCOUNTER — Ambulatory Visit (INDEPENDENT_AMBULATORY_CARE_PROVIDER_SITE_OTHER): Payer: Managed Care, Other (non HMO) | Admitting: Family Medicine

## 2018-09-30 VITALS — BP 120/80 | HR 102 | Temp 98.7°F | Wt 267.0 lb

## 2018-09-30 DIAGNOSIS — R42 Dizziness and giddiness: Secondary | ICD-10-CM | POA: Diagnosis not present

## 2018-09-30 MED ORDER — OMEPRAZOLE 40 MG PO CPDR: 40.0000 mg | DELAYED_RELEASE_CAPSULE | Freq: Every day | ORAL | 3 refills | Status: DC | PRN

## 2018-09-30 NOTE — Progress Notes (Signed)
   Subjective:    Patient ID: Randy Waters, male    DOB: 1961-01-11, 58 y.o.   MRN: [id number]  HPI Here to discuss returning to work. He feels great and he has completed PT. The dizziness is gone and he scored 30 out of 30 on the PT testing.    Review of Systems  Constitutional: Negative.   Respiratory: Negative.   Cardiovascular: Negative.   Neurological: Negative.        Objective:   Physical Exam Constitutional:      Appearance: Normal appearance.  Cardiovascular:     Rate and Rhythm: Normal rate and regular rhythm.     Pulses: Normal pulses.     Heart sounds: Normal heart sounds.  Pulmonary:     Effort: Pulmonary effort is normal.  Neurological:     General: No focal deficit present.     Mental Status: He is alert and oriented to person, place, and time.     Cranial Nerves: No cranial nerve deficit.     Coordination: Coordination normal.           Assessment & Plan:  His vertigo has resolved. He is cleared to return to work without restrictions as of 10-01-18. Alysia Penna, MD

## 2018-10-04 ENCOUNTER — Telehealth: Payer: Self-pay

## 2018-10-04 NOTE — Telephone Encounter (Signed)
Dr. Sarajane Jews please advise on letter for the pt. Thanks

## 2018-10-04 NOTE — Telephone Encounter (Signed)
Copied from [place] [id number]. Topic: General - Inquiry >> Oct 04, 2018  8:43 AM Virl Axe D wrote: Reason for CRM: Pt called and states he was recently released to return to work on 09/30/18. His employer is asking for letter from Dr. 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 He would like for Dr. Barbie Banner assistant to call him with questions or once the letter is ready for pickup. Please advise. [phone]

## 2018-10-07 NOTE — Telephone Encounter (Signed)
The letter is ready  

## 2018-10-07 NOTE — Telephone Encounter (Signed)
Called and spoke with pt and he is aware that the letter has been completed and this has been left up front for him to come by and pick up. Placed in the folders up front.

## 2018-10-10 ENCOUNTER — Encounter: Payer: Managed Care, Other (non HMO) | Admitting: Physical Therapy

## 2018-10-17 ENCOUNTER — Ambulatory Visit: Payer: Managed Care, Other (non HMO) | Admitting: Physical Therapy

## 2018-10-24 ENCOUNTER — Encounter: Payer: Managed Care, Other (non HMO) | Admitting: Physical Therapy

## 2018-10-31 ENCOUNTER — Ambulatory Visit: Payer: Managed Care, Other (non HMO) | Attending: Internal Medicine | Admitting: Physical Therapy

## 2018-10-31 ENCOUNTER — Encounter: Payer: Self-pay | Admitting: Physical Therapy

## 2018-10-31 DIAGNOSIS — R2681 Unsteadiness on feet: Secondary | ICD-10-CM | POA: Insufficient documentation

## 2018-10-31 DIAGNOSIS — R42 Dizziness and giddiness: Secondary | ICD-10-CM | POA: Diagnosis present

## 2018-10-31 NOTE — Patient Instructions (Signed)
Access Code: [email or web address]  URL: [email or web address]  Date: 10/31/2018  Prepared by: Barry Brunner   Exercises  Standing with Head Rotation - 10 reps - 1 sets - - hold - 3x daily  Standing Gaze Stabilization with Head Rotation - 2-3 reps - 3x daily - 7x weekly  Standing Gaze Stabilization with Head Nod - 2-3 reps - 3x daily - 7x weekly
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
## 2018-11-07 ENCOUNTER — Ambulatory Visit: Payer: Managed Care, Other (non HMO) | Admitting: Physical Therapy

## 2018-11-08 ENCOUNTER — Ambulatory Visit: Payer: Managed Care, Other (non HMO) | Admitting: Physical Therapy

## 2018-11-08 ENCOUNTER — Encounter: Payer: Self-pay | Admitting: Physical Therapy

## 2018-11-08 DIAGNOSIS — R42 Dizziness and giddiness: Secondary | ICD-10-CM | POA: Diagnosis not present

## 2018-11-08 DIAGNOSIS — R2681 Unsteadiness on feet: Secondary | ICD-10-CM
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
## 2018-12-02 ENCOUNTER — Telehealth: Payer: Self-pay

## 2018-12-02 NOTE — Telephone Encounter (Signed)
I called pt about his appt on Monday with Janett Billow NP. I explain the COVID 19 reason and that we are offering video or telephone visit. Pt stated he does not prefer video and would do telephone visit. Pt began to stay he did not see a need for the visit at this time. He will call back if he needs anything and will r/s. I stated Janett Billow NP has prescribed Antivert and lipitor for him. I also stated if he needs refills or has questions he can contact his PCP to manage ongoing. PT verbalized understanding.

## 2018-12-09 ENCOUNTER — Ambulatory Visit: Payer: Managed Care, Other (non HMO) | Admitting: Adult Health

## 2018-12-09 ENCOUNTER — Encounter: Payer: Self-pay | Admitting: Physical Therapy

## 2019-04-29 ENCOUNTER — Encounter: Payer: Self-pay | Admitting: Family Medicine

## 2019-04-29 LAB — HM DIABETES EYE EXAM

## 2019-07-02 ENCOUNTER — Encounter: Payer: Self-pay | Admitting: Family Medicine

## 2019-07-02 ENCOUNTER — Telehealth (INDEPENDENT_AMBULATORY_CARE_PROVIDER_SITE_OTHER): Payer: Managed Care, Other (non HMO) | Admitting: Family Medicine

## 2019-07-02 ENCOUNTER — Other Ambulatory Visit: Payer: Self-pay

## 2019-07-02 DIAGNOSIS — I10 Essential (primary) hypertension: Secondary | ICD-10-CM

## 2019-07-02 DIAGNOSIS — N182 Chronic kidney disease, stage 2 (mild): Secondary | ICD-10-CM

## 2019-07-02 DIAGNOSIS — K219 Gastro-esophageal reflux disease without esophagitis: Secondary | ICD-10-CM

## 2019-07-02 DIAGNOSIS — I5032 Chronic diastolic (congestive) heart failure: Secondary | ICD-10-CM

## 2019-07-02 DIAGNOSIS — N138 Other obstructive and reflux uropathy: Secondary | ICD-10-CM

## 2019-07-02 DIAGNOSIS — E1129 Type 2 diabetes mellitus with other diabetic kidney complication: Secondary | ICD-10-CM

## 2019-07-02 DIAGNOSIS — E785 Hyperlipidemia, unspecified: Secondary | ICD-10-CM

## 2019-07-02 DIAGNOSIS — N401 Enlarged prostate with lower urinary tract symptoms: Secondary | ICD-10-CM

## 2019-07-02 MED ORDER — ASPIRIN 81 MG PO TBEC: 81.0000 mg | DELAYED_RELEASE_TABLET | Freq: Every day | ORAL | 3 refills | Status: AC

## 2019-07-02 MED ORDER — LISINOPRIL 20 MG PO TABS: 20.0000 mg | ORAL_TABLET | Freq: Every day | ORAL | 3 refills | Status: DC

## 2019-07-02 MED ORDER — INDOMETHACIN 50 MG PO CAPS: 50.0000 mg | ORAL_CAPSULE | Freq: Three times a day (TID) | ORAL | 3 refills | Status: DC | PRN

## 2019-07-02 MED ORDER — METFORMIN HCL 1000 MG PO TABS: 1000.0000 mg | ORAL_TABLET | Freq: Two times a day (BID) | ORAL | 3 refills | Status: DC

## 2019-07-02 MED ORDER — PIOGLITAZONE HCL 45 MG PO TABS: 45.0000 mg | ORAL_TABLET | Freq: Every day | ORAL | 3 refills | Status: DC

## 2019-07-02 MED ORDER — ALLOPURINOL 300 MG PO TABS: 300.0000 mg | ORAL_TABLET | Freq: Every day | ORAL | 3 refills | Status: DC

## 2019-07-02 MED ORDER — ATORVASTATIN CALCIUM 40 MG PO TABS: 40.0000 mg | ORAL_TABLET | Freq: Every day | ORAL | 3 refills | Status: DC

## 2019-07-02 MED ORDER — POTASSIUM CITRATE ER 10 MEQ (1080 MG) PO TBCR: 10.0000 meq | EXTENDED_RELEASE_TABLET | Freq: Two times a day (BID) | ORAL | 3 refills | Status: DC

## 2019-07-02 MED ORDER — METHOCARBAMOL 750 MG PO TABS: 750.0000 mg | ORAL_TABLET | Freq: Four times a day (QID) | ORAL | 5 refills | Status: AC | PRN

## 2019-07-02 MED ORDER — OMEPRAZOLE 40 MG PO CPDR: 40.0000 mg | DELAYED_RELEASE_CAPSULE | Freq: Every day | ORAL | 3 refills | Status: DC | PRN

## 2019-07-02 MED ORDER — GLIPIZIDE 10 MG PO TABS: 10.0000 mg | ORAL_TABLET | Freq: Two times a day (BID) | ORAL | 3 refills | Status: DC
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
## 2019-07-16 ENCOUNTER — Other Ambulatory Visit (INDEPENDENT_AMBULATORY_CARE_PROVIDER_SITE_OTHER): Payer: Managed Care, Other (non HMO)

## 2019-07-16 ENCOUNTER — Other Ambulatory Visit: Payer: Self-pay

## 2019-07-16 DIAGNOSIS — N401 Enlarged prostate with lower urinary tract symptoms: Secondary | ICD-10-CM

## 2019-07-16 DIAGNOSIS — N138 Other obstructive and reflux uropathy: Secondary | ICD-10-CM

## 2019-07-16 DIAGNOSIS — I5032 Chronic diastolic (congestive) heart failure: Secondary | ICD-10-CM | POA: Diagnosis not present

## 2019-07-16 DIAGNOSIS — R300 Dysuria: Secondary | ICD-10-CM

## 2019-07-16 DIAGNOSIS — E1129 Type 2 diabetes mellitus with other diabetic kidney complication: Secondary | ICD-10-CM

## 2019-07-16 LAB — BASIC METABOLIC PANEL
BUN: 20 mg/dL (ref 6–23)
CO2: 28 mEq/L (ref 19–32)
Calcium: 9.5 mg/dL (ref 8.4–10.5)
Chloride: 103 mEq/L (ref 96–112)
Creatinine, Ser: 1.19 mg/dL (ref 0.40–1.50)
GFR: 62.58 mL/min (ref >60.00)
Glucose, Bld: 231 mg/dL — ABNORMAL HIGH (ref 70–99)
Potassium: 4.6 mEq/L (ref 3.5–5.1)
Sodium: 140 mEq/L (ref 135–145)

## 2019-07-16 LAB — BRAIN NATRIURETIC PEPTIDE: Pro B Natriuretic peptide (BNP): 14 pg/mL (ref 0.0–100.0)

## 2019-07-16 LAB — CBC WITH DIFFERENTIAL/PLATELET
Basophils Absolute: 0 10*3/uL (ref 0.0–0.1)
Basophils Relative: 0.5 % (ref 0.0–3.0)
Eosinophils Absolute: 0.2 10*3/uL (ref 0.0–0.7)
Eosinophils Relative: 3.1 % (ref 0.0–5.0)
HCT: 42.8 % (ref 39.0–52.0)
Hemoglobin: 14 g/dL (ref 13.0–17.0)
Lymphocytes Relative: 24.4 % (ref 12.0–46.0)
Lymphs Abs: 1.9 10*3/uL (ref 0.7–4.0)
MCHC: 32.7 g/dL (ref 30.0–36.0)
MCV: 96.1 fl (ref 78.0–100.0)
Monocytes Absolute: 0.5 10*3/uL (ref 0.1–1.0)
Monocytes Relative: 6 % (ref 3.0–12.0)
Neutro Abs: 5.2 10*3/uL (ref 1.4–7.7)
Neutrophils Relative %: 66 % (ref 43.0–77.0)
Platelets: 283 10*3/uL (ref 150.0–400.0)
RBC: 4.45 Mil/uL (ref 4.22–5.81)
RDW: 13.8 % (ref 11.5–15.5)
WBC: 7.8 10*3/uL (ref 4.0–10.5)

## 2019-07-16 LAB — LIPID PANEL
Cholesterol: 88 mg/dL (ref 0–200)
HDL: 25.7 mg/dL — ABNORMAL LOW (ref >39.00)
LDL Cholesterol: 37 mg/dL (ref 0–99)
NonHDL: 62.25
Total CHOL/HDL Ratio: 3
Triglycerides: 128 mg/dL (ref 0.0–149.0)
VLDL: 25.6 mg/dL (ref 0.0–40.0)

## 2019-07-16 LAB — POC URINALSYSI DIPSTICK (AUTOMATED)
Glucose, UA: POSITIVE — AB
Leukocytes, UA: NEGATIVE
Protein, UA: POSITIVE — AB
Spec Grav, UA: >=1.03 — AB (ref 1.010–1.025)
Urobilinogen, UA: 0.2 E.U./dL
pH, UA: 6 (ref 5.0–8.0)

## 2019-07-16 LAB — HEMOGLOBIN A1C: Hgb A1c MFr Bld: 7.7 % — ABNORMAL HIGH (ref 4.6–6.5)

## 2019-07-16 LAB — HEPATIC FUNCTION PANEL
ALT: 27 U/L (ref 0–53)
AST: 23 U/L (ref 0–37)
Albumin: 4.4 g/dL (ref 3.5–5.2)
Alkaline Phosphatase: 95 U/L (ref 39–117)
Bilirubin, Direct: 0.1 mg/dL (ref 0.0–0.3)
Total Bilirubin: 0.6 mg/dL (ref 0.2–1.2)
Total Protein: 6.5 g/dL (ref 6.0–8.3)

## 2019-07-16 LAB — PSA: PSA: 1.11 ng/mL (ref 0.10–4.00)

## 2019-07-16 LAB — TSH: TSH: 2.88 u[IU]/mL (ref 0.35–4.50)

## 2019-07-16 NOTE — Progress Notes (Unsigned)
akw

## 2019-07-18 LAB — URINE CULTURE
MICRO NUMBER:: 1114731
SPECIMEN QUALITY:: ADEQUATE
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
## 2019-10-01 ENCOUNTER — Other Ambulatory Visit: Payer: Self-pay | Admitting: Family Medicine

## 2019-11-24 ENCOUNTER — Ambulatory Visit: Payer: Managed Care, Other (non HMO)

## 2019-11-24 ENCOUNTER — Ambulatory Visit: Payer: Managed Care, Other (non HMO) | Attending: Internal Medicine

## 2019-11-24 DIAGNOSIS — Z23 Encounter for immunization: Secondary | ICD-10-CM

## 2019-11-24 NOTE — Progress Notes (Signed)
   Covid-19 Vaccination Clinic  Name:  Randy Waters    MRN: [id number] DOB: 02-Jul-1961  11/24/2019  [name] was observed post Covid-19 immunization for 15 minutes without incident. He was provided with Vaccine Information Sheet and instruction to access the V-Safe system.   [name] was instructed to call 911 with any severe reactions post vaccine: Marland Kitchen Difficulty breathing  . Swelling of face and throat  . A fast heartbeat  . A bad rash all over body  . Dizziness and weakness   Immunizations Administered    Name Date Dose VIS Date Route   Pfizer COVID-19 Vaccine 11/24/2019 10:48 AM 0.3 mL 08/08/2019 Intramuscular   Manufacturer: [place]   Lot: [id number]   [place]: [id number]

## 2019-12-17 ENCOUNTER — Ambulatory Visit: Payer: Managed Care, Other (non HMO) | Attending: Internal Medicine

## 2019-12-17 DIAGNOSIS — Z23 Encounter for immunization: Secondary | ICD-10-CM

## 2019-12-17 NOTE — Progress Notes (Signed)
   Covid-19 Vaccination Clinic  Name:  Randy Waters    MRN: [id number] DOB: 1960-11-25  12/17/2019  [name] was observed post Covid-19 immunization for 15 minutes without incident. He was provided with Vaccine Information Sheet and instruction to access the V-Safe system.   [name] was instructed to call 911 with any severe reactions post vaccine: Marland Kitchen Difficulty breathing  . Swelling of face and throat  . A fast heartbeat  . A bad rash all over body  . Dizziness and weakness   Immunizations Administered    Name Date Dose VIS Date Route   Pfizer COVID-19 Vaccine 12/17/2019  9:17 AM 0.3 mL 10/22/2018 Intramuscular   Manufacturer: [place]   Lot: [id number]   [place]: [id number]

## 2019-12-30 ENCOUNTER — Other Ambulatory Visit: Payer: Self-pay | Admitting: Family Medicine

## 2020-05-08 ENCOUNTER — Other Ambulatory Visit: Payer: Self-pay | Admitting: Family Medicine

## 2020-07-19 ENCOUNTER — Other Ambulatory Visit: Payer: Self-pay | Admitting: Family Medicine

## 2020-08-03 ENCOUNTER — Encounter: Payer: Self-pay | Admitting: Family Medicine

## 2020-08-03 ENCOUNTER — Ambulatory Visit (INDEPENDENT_AMBULATORY_CARE_PROVIDER_SITE_OTHER): Payer: Managed Care, Other (non HMO) | Admitting: Family Medicine

## 2020-08-03 ENCOUNTER — Other Ambulatory Visit: Payer: Self-pay

## 2020-08-03 VITALS — BP 134/82 | HR 97 | Temp 98.1°F | Ht 71.0 in | Wt 273.0 lb

## 2020-08-03 DIAGNOSIS — E669 Obesity, unspecified: Secondary | ICD-10-CM | POA: Diagnosis not present

## 2020-08-03 DIAGNOSIS — E1169 Type 2 diabetes mellitus with other specified complication: Secondary | ICD-10-CM | POA: Diagnosis not present

## 2020-08-03 DIAGNOSIS — Z23 Encounter for immunization: Secondary | ICD-10-CM

## 2020-08-03 DIAGNOSIS — Z0000 Encounter for general adult medical examination without abnormal findings: Secondary | ICD-10-CM

## 2020-08-03 MED ORDER — ATORVASTATIN CALCIUM 40 MG PO TABS: 40.0000 mg | ORAL_TABLET | Freq: Every day | ORAL | 3 refills | Status: DC

## 2020-08-03 MED ORDER — ALLOPURINOL 300 MG PO TABS
300.0000 mg | ORAL_TABLET | Freq: Every day | ORAL | 3 refills | Status: DC
Start: 2020-08-03 — End: 2021-08-12

## 2020-08-03 MED ORDER — PIOGLITAZONE HCL 45 MG PO TABS
45.0000 mg | ORAL_TABLET | Freq: Every day | ORAL | 3 refills | Status: DC
Start: 2020-08-03 — End: 2021-08-12

## 2020-08-03 MED ORDER — LISINOPRIL 20 MG PO TABS
20.0000 mg | ORAL_TABLET | Freq: Every day | ORAL | 3 refills | Status: DC
Start: 2020-08-03 — End: 2021-08-12

## 2020-08-03 MED ORDER — METFORMIN HCL 1000 MG PO TABS
1000.0000 mg | ORAL_TABLET | Freq: Two times a day (BID) | ORAL | 3 refills | Status: DC
Start: 2020-08-03 — End: 2021-08-12

## 2020-08-03 MED ORDER — OMEPRAZOLE 40 MG PO CPDR
40.0000 mg | DELAYED_RELEASE_CAPSULE | Freq: Every day | ORAL | 3 refills | Status: DC | PRN
Start: 2020-08-03 — End: 2021-08-12

## 2020-08-03 MED ORDER — POTASSIUM CITRATE ER 10 MEQ (1080 MG) PO TBCR
10.0000 meq | EXTENDED_RELEASE_TABLET | Freq: Two times a day (BID) | ORAL | 3 refills | Status: DC
Start: 2020-08-03 — End: 2021-08-12

## 2020-08-03 MED ORDER — INDOMETHACIN 50 MG PO CAPS
50.0000 mg | ORAL_CAPSULE | Freq: Three times a day (TID) | ORAL | 3 refills | Status: DC | PRN
Start: 2020-08-03 — End: 2021-10-12

## 2020-08-03 MED ORDER — GLIPIZIDE 10 MG PO TABS
ORAL_TABLET | ORAL | 3 refills | Status: DC
Start: 2020-08-03 — End: 2021-08-12

## 2020-08-03 NOTE — Addendum Note (Signed)
Addended by: Marrion Coy on: 08/03/2020 01:46 PM   Modules accepted: Orders

## 2020-08-03 NOTE — Addendum Note (Signed)
Addended by: Otilio Miu on: 08/03/2020 01:51 PM   Modules accepted: Orders

## 2020-08-03 NOTE — Progress Notes (Signed)
Subjective:    Patient ID: Randy Waters, male    DOB: 10-14-1960, 59 y.o.   MRN: [id number]  HPI Here for a well exam. He feels fine but he knows he is overweight. He gets SOB when he walks any sort of distances. No chest pain.    Review of Systems  Constitutional: Negative.   HENT: Negative.   Eyes: Negative.   Respiratory: Negative.   Cardiovascular: Negative.   Gastrointestinal: Negative.   Genitourinary: Negative.   Musculoskeletal: Negative.   Skin: Negative.   Neurological: Negative.   Psychiatric/Behavioral: Negative.        Objective:   Physical Exam Constitutional:      General: He is not in acute distress.    Appearance: He is well-developed. He is obese. He is not diaphoretic.  HENT:     Head: Normocephalic and atraumatic.     Right Ear: External ear normal.     Left Ear: External ear normal.     Nose: Nose normal.     Mouth/Throat:     Pharynx: No oropharyngeal exudate.  Eyes:     General: No scleral icterus.       Right eye: No discharge.        Left eye: No discharge.     Conjunctiva/sclera: Conjunctivae normal.     Pupils: Pupils are equal, round, and reactive to light.  Neck:     Thyroid: No thyromegaly.     Vascular: No JVD.     Trachea: No tracheal deviation.  Cardiovascular:     Rate and Rhythm: Normal rate and regular rhythm.     Heart sounds: Normal heart sounds. No murmur heard.  No friction rub. No gallop.   Pulmonary:     Effort: Pulmonary effort is normal. No respiratory distress.     Breath sounds: Normal breath sounds. No wheezing or rales.  Chest:     Chest wall: No tenderness.  Abdominal:     General: Bowel sounds are normal. There is no distension.     Palpations: Abdomen is soft. There is no mass.     Tenderness: There is no abdominal tenderness. There is no guarding or rebound.  Genitourinary:    Penis: Normal. No tenderness.      Testes: Normal.     Prostate: Normal.     Rectum: Normal. Guaiac result negative.    Musculoskeletal:        General: No tenderness. Normal range of motion.     Cervical back: Neck supple.  Lymphadenopathy:     Cervical: No cervical adenopathy.  Skin:    General: Skin is warm and dry.     Coloration: Skin is not pale.     Findings: No erythema or rash.  Neurological:     Mental Status: He is alert and oriented to person, place, and time.     Cranial Nerves: No cranial nerve deficit.     Motor: No abnormal muscle tone.     Coordination: Coordination normal.     Deep Tendon Reflexes: Reflexes are normal and symmetric. Reflexes normal.  Psychiatric:        Behavior: Behavior normal.        Thought Content: Thought content normal.        Judgment: Judgment normal.           Assessment & Plan:  Well exam. We discussed diet and exercise. He needs to lose weight and he said he is committed to doing so. Get fasting  labs.  Alysia Penna, MD

## 2020-08-04 LAB — TSH: TSH: 1.86 mIU/L (ref 0.40–4.50)

## 2020-08-04 LAB — LIPID PANEL
Cholesterol: 97 mg/dL (ref <200)
HDL: 30 mg/dL — ABNORMAL LOW (ref >=40)
LDL Cholesterol (Calc): 44 mg/dL (calc)
Non-HDL Cholesterol (Calc): 67 mg/dL (calc) (ref <130)
Total CHOL/HDL Ratio: 3.2 (calc) (ref <5.0)
Triglycerides: 145 mg/dL (ref <150)

## 2020-08-04 LAB — CBC WITH DIFFERENTIAL/PLATELET
Absolute Monocytes: 552 cells/uL (ref 200–950)
Basophils Absolute: 62 cells/uL (ref 0–200)
Basophils Relative: 0.7 %
Eosinophils Absolute: 169 cells/uL (ref 15–500)
Eosinophils Relative: 1.9 %
HCT: 43.6 % (ref 38.5–50.0)
Hemoglobin: 14.8 g/dL (ref 13.2–17.1)
Lymphs Abs: 2083 cells/uL (ref 850–3900)
MCH: 32.1 pg (ref 27.0–33.0)
MCHC: 33.9 g/dL (ref 32.0–36.0)
MCV: 94.6 fL (ref 80.0–100.0)
MPV: 10 fL (ref 7.5–12.5)
Monocytes Relative: 6.2 %
Neutro Abs: 6034 cells/uL (ref 1500–7800)
Neutrophils Relative %: 67.8 %
Platelets: 312 10*3/uL (ref 140–400)
RBC: 4.61 10*6/uL (ref 4.20–5.80)
RDW: 12.4 % (ref 11.0–15.0)
Total Lymphocyte: 23.4 %
WBC: 8.9 10*3/uL (ref 3.8–10.8)

## 2020-08-04 LAB — BASIC METABOLIC PANEL WITH GFR
BUN: 19 mg/dL (ref 7–25)
CO2: 26 mmol/L (ref 20–32)
Calcium: 9.8 mg/dL (ref 8.6–10.3)
Chloride: 106 mmol/L (ref 98–110)
Creat: 1.03 mg/dL (ref 0.70–1.33)
GFR, Est African American: 92 mL/min/{1.73_m2} (ref >=60)
GFR, Est Non African American: 79 mL/min/{1.73_m2} (ref >=60)
Glucose, Bld: 120 mg/dL — ABNORMAL HIGH (ref 65–99)
Potassium: 4.7 mmol/L (ref 3.5–5.3)
Sodium: 142 mmol/L (ref 135–146)

## 2020-08-04 LAB — HEPATIC FUNCTION PANEL
AG Ratio: 2.3 (calc) (ref 1.0–2.5)
ALT: 32 U/L (ref 9–46)
AST: 21 U/L (ref 10–35)
Albumin: 4.6 g/dL (ref 3.6–5.1)
Alkaline phosphatase (APISO): 88 U/L (ref 35–144)
Bilirubin, Direct: 0.1 mg/dL (ref 0.0–0.2)
Globulin: 2 g/dL (calc) (ref 1.9–3.7)
Indirect Bilirubin: 0.6 mg/dL (calc) (ref 0.2–1.2)
Total Bilirubin: 0.7 mg/dL (ref 0.2–1.2)
Total Protein: 6.6 g/dL (ref 6.1–8.1)

## 2020-08-04 LAB — HEMOGLOBIN A1C
Hgb A1c MFr Bld: 7.1 % of total Hgb — ABNORMAL HIGH (ref <5.7)
Mean Plasma Glucose: 157 mg/dL
eAG (mmol/L): 8.7 mmol/L

## 2020-08-04 LAB — PSA: PSA: 1 ng/mL (ref <=4.0)

## 2020-08-25 ENCOUNTER — Telehealth (INDEPENDENT_AMBULATORY_CARE_PROVIDER_SITE_OTHER): Payer: Managed Care, Other (non HMO) | Admitting: Family Medicine

## 2020-08-25 ENCOUNTER — Encounter: Payer: Self-pay | Admitting: Family Medicine

## 2020-08-25 DIAGNOSIS — J069 Acute upper respiratory infection, unspecified: Secondary | ICD-10-CM | POA: Diagnosis not present

## 2020-08-25 MED ORDER — DOXYCYCLINE HYCLATE 100 MG PO TABS: 100.0000 mg | ORAL_TABLET | Freq: Two times a day (BID) | ORAL | 0 refills | Status: DC

## 2020-08-25 MED ORDER — BENZONATATE 100 MG PO CAPS: 100.0000 mg | ORAL_CAPSULE | Freq: Three times a day (TID) | ORAL | 0 refills | Status: DC | PRN
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
## 2020-10-18 ENCOUNTER — Other Ambulatory Visit: Payer: Self-pay

## 2020-10-18 ENCOUNTER — Ambulatory Visit (AMBULATORY_SURGERY_CENTER): Payer: Self-pay | Admitting: *Deleted

## 2020-10-18 VITALS — Ht 71.0 in | Wt 265.0 lb

## 2020-10-18 DIAGNOSIS — Z800 Family history of malignant neoplasm of digestive organs: Secondary | ICD-10-CM

## 2020-10-18 DIAGNOSIS — Z86010 Personal history of colonic polyps: Secondary | ICD-10-CM

## 2020-10-18 MED ORDER — PLENVU 140 G PO SOLR: 1.0000 | ORAL | 0 refills | Status: DC

## 2020-10-18 NOTE — Progress Notes (Signed)
No egg or soy allergy known to patient  No issues with past sedation with any surgeries or procedures No intubation problems in the past  No FH of Malignant Hyperthermia No diet pills per patient No home 02 use per patient  No blood thinners per patient  Pt denies issues with constipation  No A fib or A flutter  EMMI video to pt or via [place] 19 guidelines implemented in PV today with Pt and RN  Pt is fully vaccinated  for Covid   Plenvu  Coupon given to pt in PV today , Code to Pharmacy and  NO PA's for preps discussed with pt In PV today  Discussed with pt there will be an out-of-pocket cost for prep and that varies from $0 to 70 dollars   Due to the COVID-19 pandemic we are asking patients to follow certain guidelines.  Pt aware of COVID protocols and [facility] guidelines

## 2020-10-26 ENCOUNTER — Encounter: Payer: Self-pay | Admitting: Gastroenterology

## 2020-11-01 ENCOUNTER — Ambulatory Visit (AMBULATORY_SURGERY_CENTER): Payer: Managed Care, Other (non HMO) | Admitting: Gastroenterology

## 2020-11-01 ENCOUNTER — Encounter: Payer: Self-pay | Admitting: Gastroenterology

## 2020-11-01 ENCOUNTER — Other Ambulatory Visit: Payer: Self-pay

## 2020-11-01 VITALS — BP 97/66 | HR 100 | Temp 97.3°F | Resp 18 | Ht 71.0 in | Wt 265.0 lb

## 2020-11-01 DIAGNOSIS — D123 Benign neoplasm of transverse colon: Secondary | ICD-10-CM

## 2020-11-01 DIAGNOSIS — D128 Benign neoplasm of rectum: Secondary | ICD-10-CM

## 2020-11-01 DIAGNOSIS — D125 Benign neoplasm of sigmoid colon: Secondary | ICD-10-CM | POA: Diagnosis not present

## 2020-11-01 DIAGNOSIS — D122 Benign neoplasm of ascending colon: Secondary | ICD-10-CM

## 2020-11-01 DIAGNOSIS — Z800 Family history of malignant neoplasm of digestive organs: Secondary | ICD-10-CM

## 2020-11-01 DIAGNOSIS — D124 Benign neoplasm of descending colon: Secondary | ICD-10-CM | POA: Diagnosis not present

## 2020-11-01 DIAGNOSIS — Z86010 Personal history of colonic polyps: Secondary | ICD-10-CM | POA: Diagnosis present

## 2020-11-01 HISTORY — PX: COLONOSCOPY: SHX174

## 2020-11-01 MED ORDER — SODIUM CHLORIDE 0.9 % IV SOLN: 500.0000 mL | Freq: Once | INTRAVENOUS | Status: DC

## 2020-11-01 NOTE — Patient Instructions (Signed)
YOU HAD AN ENDOSCOPIC PROCEDURE TODAY AT THE [facility]:   Refer to the procedure report that was given to you for any specific questions about what was found during the examination.  If the procedure report does not answer your questions, please call your gastroenterologist to clarify.  If you requested that your care partner not be given the details of your procedure findings, then the procedure report has been included in a sealed envelope for you to review at your convenience later.  YOU SHOULD EXPECT: Some feelings of bloating in the abdomen. Passage of more gas than usual.  Walking can help get rid of the air that was put into your GI tract during the procedure and reduce the bloating. If you had a lower endoscopy (such as a colonoscopy or flexible sigmoidoscopy) you may notice spotting of blood in your stool or on the toilet paper. If you underwent a bowel prep for your procedure, you may not have a normal bowel movement for a few days.  Please Note:  You might notice some irritation and congestion in your nose or some drainage.  This is from the oxygen used during your procedure.  There is no need for concern and it should clear up in a day or so.  SYMPTOMS TO REPORT IMMEDIATELY:   Following lower endoscopy (colonoscopy or flexible sigmoidoscopy):  Excessive amounts of blood in the stool  Significant tenderness or worsening of abdominal pains  Swelling of the abdomen that is new, acute  Fever of 100F or higher   Following upper endoscopy (EGD)  Vomiting of blood or coffee ground material  New chest pain or pain under the shoulder blades  Painful or persistently difficult swallowing  New shortness of breath  Fever of 100F or higher  Black, tarry-looking stools  For urgent or emergent issues, a gastroenterologist can be reached at any hour by calling [phone]. Do not use MyChart messaging for urgent concerns.    DIET:  We do recommend a small meal at first, but  then you may proceed to your regular diet.  Drink plenty of fluids but you should avoid alcoholic beverages for 24 hours.  ACTIVITY:  You should plan to take it easy for the rest of today and you should NOT DRIVE or use heavy machinery until tomorrow (because of the sedation medicines used during the test).    FOLLOW UP: Our staff will call the number listed on your records 48-72 hours following your procedure to check on you and address any questions or concerns that you may have regarding the information given to you following your procedure. If we do not reach you, we will leave a message.  We will attempt to reach you two times.  During this call, we will ask if you have developed any symptoms of COVID 19. If you develop any symptoms (ie: fever, flu-like symptoms, shortness of breath, cough etc.) before then, please call [phone].  If you test positive for Covid 19 in the 2 weeks post procedure, please call and report this information to us.    If any biopsies were taken you will be contacted by phone or by letter within the next 1-3 weeks.  Please call us at [phone] if you have not heard about the biopsies in 3 weeks.    SIGNATURES/CONFIDENTIALITY: You and/or your care partner have signed paperwork which will be entered into your electronic medical record.  These signatures attest to the fact that that the information above on   your After Visit Summary has been reviewed and is understood.  Full responsibility of the confidentiality of this discharge information lies with you and/or your care-partner. 

## 2020-11-01 NOTE — Progress Notes (Signed)
AR - Check-in  SM - VS  Pt's states no medical or surgical changes since previsit or office visit.

## 2020-11-01 NOTE — Progress Notes (Signed)
Called to room to assist during endoscopic procedure.  Patient ID and intended procedure confirmed with present staff. Received instructions for my participation in the procedure from the performing physician.  

## 2020-11-01 NOTE — Op Note (Signed)
[facility] Patient Name: Randy Waters Procedure Date: 11/01/2020 10:32 AM MRN: [id number] Endoscopist: Thornton Park MD, MD Age: 60 Referring MD:  Date of Birth: [birth year] Gender: Male Account #: [id number] Procedure:                Colonoscopy Indications:              Surveillance: Personal history of adenomatous                            polyps on last colonoscopy > 5 years ago                           Two tubular adenomas on a colonoscopy in 2009                           Father with colon cancer in his 36s. Distant cousin                            with colon cancer. Medicines:                Monitored Anesthesia Care Procedure:                Pre-Anesthesia Assessment:                           - Prior to the procedure, a History and Physical                            was performed, and patient medications and                            allergies were reviewed. The patient's tolerance of                            previous anesthesia was also reviewed. The risks                            and benefits of the procedure and the sedation                            options and risks were discussed with the patient.                            All questions were answered, and informed consent                            was obtained. Prior Anticoagulants: The patient has                            taken no previous anticoagulant or antiplatelet                            agents. ASA Grade Assessment: III - A patient with  severe systemic disease. After reviewing the risks                            and benefits, the patient was deemed in                            satisfactory condition to undergo the procedure.                           After obtaining informed consent, the colonoscope                            was passed under direct vision. Throughout the                            procedure, the patient's blood pressure, pulse, and                             oxygen saturations were monitored continuously. The                            Olympus CF-HQ190L [id number]) Colonoscope was                            introduced through the anus and advanced to the 3                            cm into the ileum. The colonoscopy was performed                            without difficulty. The patient tolerated the                            procedure well. The quality of the bowel                            preparation was good. The terminal ileum, ileocecal                            valve, appendiceal orifice, and rectum were                            photographed. Scope In: 10:39:35 AM Scope Out: 11:00:42 AM Scope Withdrawal Time: 0 hours 18 minutes 43 seconds  Total Procedure Duration: 0 hours 21 minutes 7 seconds  Findings:                 The perianal and digital rectal examinations were                            normal.                           Multiple small and large-mouthed diverticula were  found in the sigmoid colon, descending colon and                            ascending colon.                           A 3 mm polyp was found in the rectum. The polyp was                            sessile. The polyp was removed with a cold snare.                            Resection and retrieval were complete. Estimated                            blood loss was minimal.                           A 5 mm polyp was found in the sigmoid colon. The                            polyp was sessile. The polyp was removed with a                            cold snare. Resection and retrieval were complete.                            Estimated blood loss was minimal.                           Three sessile polyps were found in the descending                            colon. The polyps were 3 mm in size. These polyps                            were removed with a cold snare. Resection and                             retrieval were complete. Estimated blood loss was                            minimal.                           Two sessile polyps were found in the transverse                            colon. The polyps were 1 to 4 mm in size. These                            polyps were removed with a cold snare. Resection  and retrieval were complete. Estimated blood loss                            was minimal.                           A 2 mm polyp was found in the ascending colon. The                            polyp was flat. The polyp was removed with a cold                            snare. Resection and retrieval were complete.                            Estimated blood loss was minimal.                           The exam was otherwise without abnormality on                            direct and retroflexion views. Complications:            No immediate complications. Estimated blood loss:                            Minimal. Estimated Blood Loss:     Estimated blood loss was minimal. Impression:               - Diverticulosis in the sigmoid colon, in the                            descending colon and in the ascending colon.                           - One 3 mm polyp in the rectum, removed with a cold                            snare. Resected and retrieved.                           - One 5 mm polyp in the sigmoid colon, removed with                            a cold snare. Resected and retrieved.                           - Three 3 mm polyps in the descending colon,                            removed with a cold snare. Resected and retrieved.                           - Two 1 to 4 mm polyps in the transverse colon,  removed with a cold snare. Resected and retrieved.                           - One 2 mm polyp in the ascending colon, removed                            with a cold snare. Resected and retrieved.                           - The  examination was otherwise normal on direct                            and retroflexion views. Recommendation:           - Patient has a contact number available for                            emergencies. The signs and symptoms of potential                            delayed complications were discussed with the                            patient. Return to normal activities tomorrow.                            Written discharge instructions were provided to the                            patient.                           - High fiber diet.                           - Continue present medications.                           - Await pathology results.                           - Repeat colonoscopy date to be determined after                            pending pathology results are reviewed for                            surveillance.                           - Emerging evidence supports eating a diet of                            fruits, vegetables, grains, calcium, and yogurt  while reducing red meat and alcohol may reduce the                            risk of colon cancer.                           - Given these results, all first degree relatives                            (brothers, sisters, children, parents) should start                            colon cancer screening at age 1.                           - Thank you for allowing me to be involved in your                            colon cancer prevention. Thornton Park MD, MD 11/01/2020 11:09:11 AM This report has been signed electronically.

## 2020-11-01 NOTE — Progress Notes (Signed)
PT taken to PACU. Monitors in place. VSS. Report given to RN. 

## 2020-11-03 ENCOUNTER — Telehealth: Payer: Self-pay

## 2020-11-03 NOTE — Telephone Encounter (Signed)
  Follow up Call-  Call back number 11/01/2020  Post procedure Call Back phone  # [phone] cell  Permission to leave phone message Yes  Some recent data might be hidden     Patient questions:  Do you have a fever, pain , or abdominal swelling? No. Pain Score  0 *  Have you tolerated food without any problems? Yes.    Have you been able to return to your normal activities? Yes.    Do you have any questions about your discharge instructions: Diet   No. Medications  No. Follow up visit  No.  Do you have questions or concerns about your Care? No.  Actions: * If pain score is 4 or above: No action needed, pain <4.   1. Have you developed a fever since your procedure? No   2.   Have you had an respiratory symptoms (SOB or cough) since your procedure? No   3.   Have you tested positive for COVID 19 since your procedure? No   4.   Have you had any family members/close contacts diagnosed with the COVID 19 since your procedure?  No    If yes to any of these questions please route to Joylene John, RN and Joella Prince, RN

## 2020-11-04 LAB — HM DIABETES EYE EXAM

## 2020-11-08 ENCOUNTER — Encounter: Payer: Self-pay | Admitting: Gastroenterology

## 2021-08-12 ENCOUNTER — Other Ambulatory Visit: Payer: Self-pay | Admitting: Family Medicine

## 2021-09-25 ENCOUNTER — Other Ambulatory Visit: Payer: Self-pay | Admitting: Family Medicine

## 2021-09-26 NOTE — Telephone Encounter (Signed)
Left detailed message advised pt to call and schedule CPE / medication refill appointment

## 2021-09-29 NOTE — Telephone Encounter (Signed)
Spoke with pt states that he has enough refills to last him to his CPE appointment with Dr Sarajane Jews on 10/12/2021, refills will be done at the appointment

## 2021-10-12 ENCOUNTER — Encounter: Payer: Self-pay | Admitting: Family Medicine

## 2021-10-12 ENCOUNTER — Ambulatory Visit (INDEPENDENT_AMBULATORY_CARE_PROVIDER_SITE_OTHER): Payer: Managed Care, Other (non HMO) | Admitting: Family Medicine

## 2021-10-12 VITALS — BP 120/80 | HR 102 | Temp 98.7°F | Ht 71.0 in | Wt 264.0 lb

## 2021-10-12 DIAGNOSIS — Z23 Encounter for immunization: Secondary | ICD-10-CM | POA: Diagnosis not present

## 2021-10-12 DIAGNOSIS — M1000 Idiopathic gout, unspecified site: Secondary | ICD-10-CM | POA: Diagnosis not present

## 2021-10-12 DIAGNOSIS — Z0000 Encounter for general adult medical examination without abnormal findings: Secondary | ICD-10-CM

## 2021-10-12 DIAGNOSIS — Z125 Encounter for screening for malignant neoplasm of prostate: Secondary | ICD-10-CM

## 2021-10-12 LAB — BASIC METABOLIC PANEL
BUN: 15 mg/dL (ref 6–23)
CO2: 25 mEq/L (ref 19–32)
Calcium: 9.8 mg/dL (ref 8.4–10.5)
Chloride: 105 mEq/L (ref 96–112)
Creatinine, Ser: 1.14 mg/dL (ref 0.40–1.50)
GFR: 69.57 mL/min (ref >60.00)
Glucose, Bld: 164 mg/dL — ABNORMAL HIGH (ref 70–99)
Potassium: 4.4 mEq/L (ref 3.5–5.1)
Sodium: 141 mEq/L (ref 135–145)

## 2021-10-12 LAB — HEMOGLOBIN A1C: Hgb A1c MFr Bld: 8.6 % — ABNORMAL HIGH (ref 4.6–6.5)

## 2021-10-12 LAB — CBC WITH DIFFERENTIAL/PLATELET
Basophils Absolute: 0 10*3/uL (ref 0.0–0.1)
Basophils Relative: 0.5 % (ref 0.0–3.0)
Eosinophils Absolute: 0.3 10*3/uL (ref 0.0–0.7)
Eosinophils Relative: 3.6 % (ref 0.0–5.0)
HCT: 43.8 % (ref 39.0–52.0)
Hemoglobin: 14.4 g/dL (ref 13.0–17.0)
Lymphocytes Relative: 18.9 % (ref 12.0–46.0)
Lymphs Abs: 1.4 10*3/uL (ref 0.7–4.0)
MCHC: 33 g/dL (ref 30.0–36.0)
MCV: 94.9 fl (ref 78.0–100.0)
Monocytes Absolute: 0.5 10*3/uL (ref 0.1–1.0)
Monocytes Relative: 7.4 % (ref 3.0–12.0)
Neutro Abs: 5 10*3/uL (ref 1.4–7.7)
Neutrophils Relative %: 69.6 % (ref 43.0–77.0)
Platelets: 273 10*3/uL (ref 150.0–400.0)
RBC: 4.61 Mil/uL (ref 4.22–5.81)
RDW: 14.2 % (ref 11.5–15.5)
WBC: 7.2 10*3/uL (ref 4.0–10.5)

## 2021-10-12 LAB — LIPID PANEL
Cholesterol: 194 mg/dL (ref 0–200)
HDL: 31.9 mg/dL — ABNORMAL LOW (ref >39.00)
LDL Cholesterol: 125 mg/dL — ABNORMAL HIGH (ref 0–99)
NonHDL: 162.08
Total CHOL/HDL Ratio: 6
Triglycerides: 186 mg/dL — ABNORMAL HIGH (ref 0.0–149.0)
VLDL: 37.2 mg/dL (ref 0.0–40.0)

## 2021-10-12 LAB — PSA: PSA: 1.38 ng/mL (ref 0.10–4.00)

## 2021-10-12 LAB — HEPATIC FUNCTION PANEL
ALT: 36 U/L (ref 0–53)
AST: 33 U/L (ref 0–37)
Albumin: 4.5 g/dL (ref 3.5–5.2)
Alkaline Phosphatase: 83 U/L (ref 39–117)
Bilirubin, Direct: 0.1 mg/dL (ref 0.0–0.3)
Total Bilirubin: 0.5 mg/dL (ref 0.2–1.2)
Total Protein: 7.4 g/dL (ref 6.0–8.3)

## 2021-10-12 LAB — TSH: TSH: 1.74 u[IU]/mL (ref 0.35–5.50)

## 2021-10-12 LAB — URIC ACID: Uric Acid, Serum: 4.8 mg/dL (ref 4.0–7.8)

## 2021-10-12 MED ORDER — GLUCOSE BLOOD VI STRP: ORAL_STRIP | 12 refills | Status: DC

## 2021-10-12 MED ORDER — PIOGLITAZONE HCL 45 MG PO TABS
45.0000 mg | ORAL_TABLET | Freq: Every day | ORAL | 3 refills | Status: DC
Start: 2021-10-12 — End: 2022-10-06

## 2021-10-12 MED ORDER — ONETOUCH ULTRASOFT LANCETS MISC: 12 refills | Status: AC

## 2021-10-12 MED ORDER — ALLOPURINOL 300 MG PO TABS: 300.0000 mg | ORAL_TABLET | Freq: Every day | ORAL | 3 refills | Status: DC

## 2021-10-12 MED ORDER — ATORVASTATIN CALCIUM 40 MG PO TABS: 40.0000 mg | ORAL_TABLET | Freq: Every day | ORAL | 3 refills | Status: DC

## 2021-10-12 MED ORDER — POTASSIUM CITRATE ER 10 MEQ (1080 MG) PO TBCR
10.0000 meq | EXTENDED_RELEASE_TABLET | Freq: Two times a day (BID) | ORAL | 3 refills | Status: DC
Start: 2021-10-12 — End: 2022-10-06

## 2021-10-12 MED ORDER — GLIPIZIDE 10 MG PO TABS: ORAL_TABLET | ORAL | 3 refills | Status: DC

## 2021-10-12 MED ORDER — METFORMIN HCL 1000 MG PO TABS: 1000.0000 mg | ORAL_TABLET | Freq: Two times a day (BID) | ORAL | 3 refills | Status: DC

## 2021-10-12 MED ORDER — INDOMETHACIN 50 MG PO CAPS: 50.0000 mg | ORAL_CAPSULE | Freq: Three times a day (TID) | ORAL | 5 refills | Status: AC | PRN

## 2021-10-12 MED ORDER — OMEPRAZOLE 40 MG PO CPDR: 40.0000 mg | DELAYED_RELEASE_CAPSULE | Freq: Every day | ORAL | 3 refills | Status: DC

## 2021-10-12 NOTE — Addendum Note (Signed)
Addended by: Wyvonne Lenz on: 10/12/2021 11:27 AM   Modules accepted: Orders

## 2021-10-12 NOTE — Progress Notes (Signed)
° °  Subjective:    Patient ID: Randy Waters, male    DOB: Jan 22, 1961, 61 y.o.   MRN: [id number]  HPI Here for a well exam. He is doing well. 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    Review of Systems  Constitutional: Negative.   HENT: Negative.    Eyes: Negative.   Respiratory: Negative.    Cardiovascular: Negative.   Gastrointestinal: Negative.   Genitourinary: Negative.   Musculoskeletal: Negative.   Skin: Negative.   Neurological: Negative.   Psychiatric/Behavioral: Negative.        Objective:   Physical Exam Constitutional:      General: He is not in acute distress.    Appearance: He is well-developed. He is obese. He is not diaphoretic.  HENT:     Head: Normocephalic and atraumatic.     Right Ear: External ear normal.     Left Ear: External ear normal.     Nose: Nose normal.     Mouth/Throat:     Pharynx: No oropharyngeal exudate.  Eyes:     General: No scleral icterus.       Right eye: No discharge.        Left eye: No discharge.     Conjunctiva/sclera: Conjunctivae normal.     Pupils: Pupils are equal, round, and reactive to light.  Neck:     Thyroid: No thyromegaly.     Vascular: No JVD.     Trachea: No tracheal deviation.  Cardiovascular:     Rate and Rhythm: Normal rate and regular rhythm.     Heart sounds: Normal heart sounds. No murmur heard.   No friction rub. No gallop.  Pulmonary:     Effort: Pulmonary effort is normal. No respiratory distress.     Breath sounds: Normal breath sounds. No wheezing or rales.  Chest:     Chest wall: No tenderness.  Abdominal:     General: Bowel sounds are normal. There is no distension.     Palpations: Abdomen is soft. There is no mass.     Tenderness: There is no abdominal tenderness. There is no guarding or rebound.  Genitourinary:    Penis: Normal. No tenderness.      Testes: Normal.     Prostate: Normal.     Rectum: Normal. Guaiac result negative.   Musculoskeletal:        General: No tenderness. Normal range of motion.     Cervical back: Neck supple.  Lymphadenopathy:     Cervical: No cervical adenopathy.  Skin:    General: Skin is warm and dry.     Coloration: Skin is not pale.     Findings: No erythema or rash.  Neurological:     Mental Status: He is alert and oriented to person, place, and time.     Cranial Nerves: No cranial nerve deficit.     Motor: No abnormal muscle tone.     Coordination: Coordination normal.     Deep Tendon Reflexes: Reflexes are normal and symmetric. Reflexes normal.  Psychiatric:        Behavior: Behavior normal.        Thought Content: Thought content normal.        Judgment: Judgment normal.          Assessment & Plan:  Well exam. We discussed diet and exercise. He knows he needs to lose weight. Get fasting labs.  Alysia Penna, MD

## 2021-10-19 ENCOUNTER — Ambulatory Visit: Payer: Managed Care, Other (non HMO) | Admitting: Family Medicine

## 2021-10-19 ENCOUNTER — Other Ambulatory Visit: Payer: Self-pay

## 2021-10-19 ENCOUNTER — Encounter: Payer: Self-pay | Admitting: Family Medicine

## 2021-10-19 ENCOUNTER — Ambulatory Visit (INDEPENDENT_AMBULATORY_CARE_PROVIDER_SITE_OTHER): Payer: Managed Care, Other (non HMO)

## 2021-10-19 VITALS — BP 110/80 | HR 130 | Temp 98.2°F | Wt 258.0 lb

## 2021-10-19 DIAGNOSIS — R0989 Other specified symptoms and signs involving the circulatory and respiratory systems: Secondary | ICD-10-CM | POA: Diagnosis not present

## 2021-10-19 DIAGNOSIS — J189 Pneumonia, unspecified organism: Secondary | ICD-10-CM

## 2021-10-19 LAB — POCT INFLUENZA A/B
Influenza A, POC: NEGATIVE
Influenza B, POC: NEGATIVE

## 2021-10-19 LAB — POC COVID19 BINAXNOW: SARS Coronavirus 2 Ag: NEGATIVE

## 2021-10-19 MED ORDER — AMOXICILLIN-POT CLAVULANATE 875-125 MG PO TABS: 1.0000 | ORAL_TABLET | Freq: Two times a day (BID) | ORAL | 0 refills | Status: DC

## 2021-10-19 MED ORDER — CEFTRIAXONE SODIUM 1 G IJ SOLR
1.0000 g | Freq: Once | INTRAMUSCULAR | Status: AC
  Administered 2021-10-19: 1 g via INTRAMUSCULAR

## 2021-10-19 NOTE — Progress Notes (Signed)
° °  Subjective:    Patient ID: Randy Waters, male    DOB: 11-03-1960, 61 y.o.   MRN: [id number]  HPI Here for 8 days of chest congestion, deep coughing which brings up clear sputum, mild SOB, headache, and diarrhea. No fever or chest pain. He is drinking fluids. He tested negative for the Covid-19 virus 2 days ago. He has tested negative for influenza and Covid here this morning.    Review of Systems  Constitutional:  Positive for fatigue. Negative for fever.  HENT: Negative.    Eyes: Negative.   Respiratory:  Positive for cough, chest tightness, shortness of breath and wheezing.   Cardiovascular: Negative.   Gastrointestinal:  Positive for diarrhea. Negative for abdominal distention, abdominal pain, blood in stool, constipation, nausea and vomiting.      Objective:   Physical Exam Constitutional:      Comments: He appears to be ill, coughing frequently   Cardiovascular:     Rate and Rhythm: Normal rate and regular rhythm.     Pulses: Normal pulses.     Heart sounds: Normal heart sounds.  Pulmonary:     Effort: Pulmonary effort is normal.     Breath sounds: No rhonchi.     Comments: Scattered wheezes and rales in the RUL  Abdominal:     General: Abdomen is flat. Bowel sounds are normal. There is no distension.     Palpations: Abdomen is soft. There is no mass.     Tenderness: There is no abdominal tenderness. There is no guarding or rebound.     Hernia: No hernia is present.  Lymphadenopathy:     Cervical: No cervical adenopathy.  Neurological:     Mental Status: He is alert.          Assessment & Plan:  RUL pneumonia. He is given a shot of Rocephin and we will follow this with 10 days of Augmentin. He will get a CXR today. Follow up next week.  Alysia Penna, MD

## 2021-11-15 ENCOUNTER — Ambulatory Visit: Payer: Managed Care, Other (non HMO) | Admitting: Family Medicine

## 2021-11-15 ENCOUNTER — Encounter: Payer: Self-pay | Admitting: Family Medicine

## 2021-11-15 VITALS — BP 110/80 | HR 118 | Temp 98.5°F | Wt 251.0 lb

## 2021-11-15 DIAGNOSIS — U071 COVID-19: Secondary | ICD-10-CM | POA: Diagnosis not present

## 2021-11-15 DIAGNOSIS — J189 Pneumonia, unspecified organism: Secondary | ICD-10-CM

## 2021-11-15 MED ORDER — ALBUTEROL SULFATE HFA 108 (90 BASE) MCG/ACT IN AERS: 2.0000 | INHALATION_SPRAY | RESPIRATORY_TRACT | 0 refills | Status: DC | PRN

## 2021-11-15 NOTE — Progress Notes (Signed)
? ?  Subjective:  ? ? Patient ID: Randy Waters, male    DOB: [birth year], 61 y.o.   MRN: [id number] ? ?HPI ?Here to follow up on a pneumonia and for a recent Covid-19 infection. We saw him on 10-19-21 for a CAP of the RUL. He was treated with 10 days of Augmentin, and he feels like he completely recovered from this. Then while he was attending some horse races (with large crowds of people) in [place], [place] he began coughing again on 11-07-21. The coughing got worse, so on 11-08-21 he tested himself for the Covid virus, and he tested positive. He then saw an urgent care in [place] on 11-09-21, and they treated him with 5 days of Paxlovid. Since then the coughing and fever have subsided, but he is still somewhat SOB and fatigued. He is drinking fluids.  ? ? ?Review of Systems  ?Constitutional:  Positive for fatigue. Negative for fever.  ?HENT: Negative.    ?Eyes: Negative.   ?Respiratory:  Positive for shortness of breath. Negative for cough and wheezing.   ?Cardiovascular: Negative.   ?Gastrointestinal: Negative.   ? ?   ?Objective:  ? Physical Exam ?Constitutional:   ?   Appearance: Normal appearance. He is not ill-appearing.  ?Cardiovascular:  ?   Rate and Rhythm: Normal rate and regular rhythm.  ?   Pulses: Normal pulses.  ?   Heart sounds: Normal heart sounds.  ?Pulmonary:  ?   Effort: Pulmonary effort is normal. No respiratory distress.  ?   Breath sounds: Normal breath sounds. No stridor. No wheezing, rhonchi or rales.  ?Lymphadenopathy:  ?   Cervical: No cervical adenopathy.  ?Neurological:  ?   Mental Status: He is alert.  ? ? ? ? ? ?   ?Assessment & Plan:  ?He has recovered from the CAP, and he has gotten over the worst of the Covid infection. We will supply him with a Ventolin inhaler to use as needed. He is written out of work today through 11-21-21 to rest. Recheck as needed. We spent a total of (32   ) minutes reviewing records and discussing these issues.  ?Alysia Penna, MD ? ? ?

## 2022-02-13 LAB — HM DIABETES EYE EXAM

## 2022-02-14 ENCOUNTER — Encounter: Payer: Self-pay | Admitting: Family Medicine

## 2022-05-29 ENCOUNTER — Telehealth: Payer: Self-pay | Admitting: Family Medicine

## 2022-05-29 DIAGNOSIS — I10 Essential (primary) hypertension: Secondary | ICD-10-CM

## 2022-05-29 DIAGNOSIS — I5032 Chronic diastolic (congestive) heart failure: Secondary | ICD-10-CM

## 2022-05-29 NOTE — Telephone Encounter (Signed)
Pt requesting info on cardiologists in this area that pcp would suggest

## 2022-05-30 NOTE — Telephone Encounter (Signed)
I suggest he find out what groups in [place] his insurance company covers and then go from there

## 2022-05-30 NOTE — Telephone Encounter (Signed)
Pt called to request a referral to:  [facility] Fax:  [phone]

## 2022-05-31 NOTE — Addendum Note (Signed)
Addended by: Alysia Penna A on: 05/31/2022 08:04 AM   Modules accepted: Orders

## 2022-05-31 NOTE — Telephone Encounter (Signed)
I did the referral 
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
## 2022-06-06 ENCOUNTER — Other Ambulatory Visit (HOSPITAL_COMMUNITY): Payer: Self-pay

## 2022-06-06 ENCOUNTER — Telehealth: Payer: Self-pay | Admitting: *Deleted

## 2022-06-06 ENCOUNTER — Ambulatory Visit: Payer: Managed Care, Other (non HMO) | Attending: Cardiology | Admitting: Cardiology

## 2022-06-06 ENCOUNTER — Encounter: Payer: Self-pay | Admitting: Cardiology

## 2022-06-06 VITALS — BP 122/86 | HR 85 | Ht 71.0 in | Wt 261.4 lb

## 2022-06-06 DIAGNOSIS — R072 Precordial pain: Secondary | ICD-10-CM | POA: Diagnosis not present

## 2022-06-06 DIAGNOSIS — E785 Hyperlipidemia, unspecified: Secondary | ICD-10-CM

## 2022-06-06 DIAGNOSIS — N182 Chronic kidney disease, stage 2 (mild): Secondary | ICD-10-CM

## 2022-06-06 DIAGNOSIS — I10 Essential (primary) hypertension: Secondary | ICD-10-CM

## 2022-06-06 DIAGNOSIS — E669 Obesity, unspecified: Secondary | ICD-10-CM

## 2022-06-06 DIAGNOSIS — I5032 Chronic diastolic (congestive) heart failure: Secondary | ICD-10-CM

## 2022-06-06 DIAGNOSIS — R0609 Other forms of dyspnea: Secondary | ICD-10-CM

## 2022-06-06 DIAGNOSIS — E1169 Type 2 diabetes mellitus with other specified complication: Secondary | ICD-10-CM

## 2022-06-06 MED ORDER — IVABRADINE HCL 5 MG PO TABS
10.0000 mg | ORAL_TABLET | Freq: Once | ORAL | 0 refills | Status: AC
  Filled 2022-06-06: qty 2, 1d supply, fill #0

## 2022-06-06 MED ORDER — METOPROLOL TARTRATE 100 MG PO TABS
100.0000 mg | ORAL_TABLET | Freq: Once | ORAL | 0 refills | Status: DC
  Filled 2022-06-06: qty 1, 1d supply, fill #0
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
## 2022-06-06 NOTE — Telephone Encounter (Signed)
-----   Message from [place] sent at 06/06/2022  2:47 PM EDT ----- Regarding: ct heart Scheduled 06/20/22 at 12:00

## 2022-06-06 NOTE — Patient Instructions (Signed)
Medication Instructions:   Your physician recommends that you continue on your current medications as directed. Please refer to the Current Medication list given to you today.  *If you need a refill on your cardiac medications before your next appointment, please call your pharmacy*   Testing/Procedures:  Your physician has requested that you have an echocardiogram. Echocardiography is a painless test that uses sound waves to create images of your heart. It provides your doctor with information about the size and shape of your heart and how well your heart's chambers and valves are working. This procedure takes approximately one hour. There are no restrictions for this procedure.     Your cardiac CT will be scheduled at one of the below locations:   [facility] [address] [place], [place] [address] [phone]   If scheduled at [facility], please arrive at the [facility] and Children's Entrance (Entrance C2) of [facility] 30 minutes prior to test start time. You can use the FREE valet parking offered at entrance C (encouraged to control the heart rate for the test)  Proceed to the [facility] Radiology Department (first floor) to check-in and test prep.  All radiology patients and guests should use entrance C2 at [facility] from [facility], even though the hospital's physical address listed is [address].     Please follow these instructions carefully (unless otherwise directed):  Hold all erectile dysfunction medications at least 3 days (72 hrs) prior to test. (Ie viagra, cialis, sildenafil, tadalafil, etc) We will administer nitroglycerin during this exam.   On the Night Before the Test: Be sure to Drink plenty of water. Do not consume any caffeinated/decaffeinated beverages or chocolate 12 hours prior to your test. Do not take any antihistamines 12 hours prior to your test.   On the Day of the  Test: Drink plenty of water until 1 hour prior to the test. Do not eat any food 1 hour prior to test. You may take your regular medications prior to the test.  TAKE IVABRADINE (CORLANOR) 10 MG BY MOUTH IN COMBINATION WITH METOPROLOL TARTRATE 100 MG BY MOUTH TWO HOURS PRIOR TO THIS TEST.       After the Test: Drink plenty of water. After receiving IV contrast, you may experience a mild flushed feeling. This is normal. On occasion, you may experience a mild rash up to 24 hours after the test. This is not dangerous. If this occurs, you can take Benadryl 25 mg and increase your fluid intake. If you experience trouble breathing, this can be serious. If it is severe call 911 IMMEDIATELY. If it is mild, please call our office. If you take any of these medications: Glipizide/Metformin, Avandament, Glucavance, please do not take 48 hours after completing test unless otherwise instructed.  We will call to schedule your test 2-4 weeks out understanding that some insurance companies will need an authorization prior to the service being performed.   For non-scheduling related questions, please contact the cardiac imaging nurse navigator should you have any questions/concerns: Marchia Bond, Cardiac Imaging Nurse Navigator Gordy Clement, Cardiac Imaging Nurse Navigator [facility] Direct Office Dial: [phone]   For scheduling needs, including cancellations and rescheduling, please call [place], [phone].    Follow-Up: At [facility], you and your health needs are our priority.  As part of our continuing mission to provide you with exceptional heart care, we have created designated Provider Care Teams.  These Care Teams include your primary Cardiologist (physician) and Advanced Practice Providers (APPs -  Physician Assistants and Nurse Practitioners) who all work together to provide you with the care you need, when you need it.  We recommend signing up for  the patient portal called "MyChart".  Sign up information is provided on this After Visit Summary.  MyChart is used to connect with patients for Virtual Visits (Telemedicine).  Patients are able to view lab/test results, encounter notes, upcoming appointments, etc.  Non-urgent messages can be sent to your provider as well.   To learn more about what you can do with MyChart, go to [email or web address].    Your next appointment:   6 month(s)  The format for your next appointment:   In Person  Provider:   Robbie Lis, PA-C, Nicholes Rough, PA-C, Melina Copa, PA-C, Ambrose Pancoast, NP, Cecilie Kicks, NP, Ermalinda Barrios, PA-C, Christen Bame, NP, or Richardson Dopp, PA-C

## 2022-06-19 ENCOUNTER — Other Ambulatory Visit (HOSPITAL_COMMUNITY): Payer: Self-pay | Admitting: *Deleted

## 2022-06-19 ENCOUNTER — Telehealth (HOSPITAL_COMMUNITY): Payer: Self-pay | Admitting: *Deleted

## 2022-06-19 DIAGNOSIS — R072 Precordial pain: Secondary | ICD-10-CM

## 2022-06-19 DIAGNOSIS — I5032 Chronic diastolic (congestive) heart failure: Secondary | ICD-10-CM

## 2022-06-19 DIAGNOSIS — N182 Chronic kidney disease, stage 2 (mild): Secondary | ICD-10-CM

## 2022-06-19 DIAGNOSIS — E785 Hyperlipidemia, unspecified: Secondary | ICD-10-CM

## 2022-06-19 DIAGNOSIS — E1169 Type 2 diabetes mellitus with other specified complication: Secondary | ICD-10-CM

## 2022-06-19 DIAGNOSIS — I10 Essential (primary) hypertension: Secondary | ICD-10-CM

## 2022-06-19 NOTE — Telephone Encounter (Signed)
Reaching out to patient to offer assistance regarding upcoming cardiac imaging study; pt verbalizes understanding of appt date/time, parking situation and where to check in, medications ordered, and verified current allergies; name and call back number provided for further questions should they arise  Gordy Clement RN Navigator Cardiac Imaging [facility] [phone] office [phone] cell  Patient to take '100mg'$  metoprolol tartrate and '10mg'$  ivabradine two hours prior to his cardiac CT scan. He is aware to obtain blood work prior to his appointment and to arrive at 11:30am.

## 2022-06-20 ENCOUNTER — Ambulatory Visit (HOSPITAL_COMMUNITY)
Admission: RE | Admit: 2022-06-20 | Discharge: 2022-06-20 | Disposition: A | Discharge status: Home / Self Care | Payer: Managed Care, Other (non HMO) | Source: Ambulatory Visit | Attending: Cardiology | Admitting: Cardiology

## 2022-06-20 DIAGNOSIS — R072 Precordial pain: Secondary | ICD-10-CM | POA: Diagnosis present

## 2022-06-20 LAB — BASIC METABOLIC PANEL
BUN/Creatinine Ratio: 16 (ref 10–24)
BUN: 21 mg/dL (ref 8–27)
CO2: 21 mmol/L (ref 20–29)
Calcium: 10 mg/dL (ref 8.6–10.2)
Chloride: 100 mmol/L (ref 96–106)
Creatinine, Ser: 1.3 mg/dL — ABNORMAL HIGH (ref 0.76–1.27)
Glucose: 193 mg/dL — ABNORMAL HIGH (ref 70–99)
Potassium: 5.1 mmol/L (ref 3.5–5.2)
Sodium: 137 mmol/L (ref 134–144)
eGFR: 63 mL/min/{1.73_m2} (ref >59)

## 2022-06-20 MED ORDER — IOHEXOL 350 MG/ML SOLN
100.0000 mL | Freq: Once | INTRAVENOUS | Status: AC | PRN
  Administered 2022-06-20: 100 mL via INTRAVENOUS

## 2022-06-20 MED ORDER — NITROGLYCERIN 0.4 MG SL SUBL
0.8000 mg | SUBLINGUAL_TABLET | Freq: Once | SUBLINGUAL | Status: AC
  Administered 2022-06-20: 0.8 mg via SUBLINGUAL

## 2022-06-20 MED ORDER — NITROGLYCERIN 0.4 MG SL SUBL
SUBLINGUAL_TABLET | SUBLINGUAL | Status: AC
  Filled 2022-06-20: qty 2

## 2022-06-23 ENCOUNTER — Ambulatory Visit (HOSPITAL_COMMUNITY): Payer: Managed Care, Other (non HMO) | Attending: Cardiovascular Disease

## 2022-06-23 DIAGNOSIS — N182 Chronic kidney disease, stage 2 (mild): Secondary | ICD-10-CM | POA: Diagnosis present

## 2022-06-23 DIAGNOSIS — R072 Precordial pain: Secondary | ICD-10-CM | POA: Insufficient documentation

## 2022-06-23 DIAGNOSIS — I77810 Thoracic aortic ectasia: Secondary | ICD-10-CM | POA: Diagnosis not present

## 2022-06-23 DIAGNOSIS — E1169 Type 2 diabetes mellitus with other specified complication: Secondary | ICD-10-CM | POA: Insufficient documentation

## 2022-06-23 DIAGNOSIS — E785 Hyperlipidemia, unspecified: Secondary | ICD-10-CM | POA: Insufficient documentation

## 2022-06-23 DIAGNOSIS — E669 Obesity, unspecified: Secondary | ICD-10-CM | POA: Diagnosis present

## 2022-06-23 DIAGNOSIS — I517 Cardiomegaly: Secondary | ICD-10-CM | POA: Diagnosis not present

## 2022-06-23 DIAGNOSIS — I5032 Chronic diastolic (congestive) heart failure: Secondary | ICD-10-CM | POA: Insufficient documentation

## 2022-06-23 DIAGNOSIS — I10 Essential (primary) hypertension: Secondary | ICD-10-CM | POA: Insufficient documentation

## 2022-06-23 LAB — ECHOCARDIOGRAM COMPLETE
Area-P 1/2: 4.29 cm2
S' Lateral: 3.1 cm

## 2022-06-26 ENCOUNTER — Telehealth: Payer: Self-pay | Admitting: *Deleted

## 2022-06-26 DIAGNOSIS — I77819 Aortic ectasia, unspecified site: Secondary | ICD-10-CM

## 2022-06-26 NOTE — Telephone Encounter (Signed)
-----   Message from Freada Bergeron, MD sent at 06/24/2022  8:11 PM EDT ----- Echo looks great with normal pumping function with no significant valve disease. 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

## 2022-06-26 NOTE — Telephone Encounter (Signed)
The patient has been notified of the result and verbalized understanding.  All questions (if any) were answered.  Pt aware he will need a repeat echo in one year to follow-up on his mild dilation of his aorta.  Pt aware I will go ahead and place the order for repeat echo in one year in the system, and send a message to our echo scheduler to call him back and arrange this appt for that time.   Pt verbalized understanding and agrees with this plan.

## 2022-07-07 ENCOUNTER — Other Ambulatory Visit: Payer: Self-pay | Admitting: Family Medicine

## 2022-10-05 ENCOUNTER — Other Ambulatory Visit: Payer: Self-pay | Admitting: Family Medicine

## 2022-10-21 IMAGING — DX DG CHEST 2V
2 series · 2 of 2 positions shown · non-contrast
Comparison: July 25, 2018

CLINICAL DATA: Cough and right upper lobe rales.

EXAM:
CHEST - 2 VIEW

[chest pa]
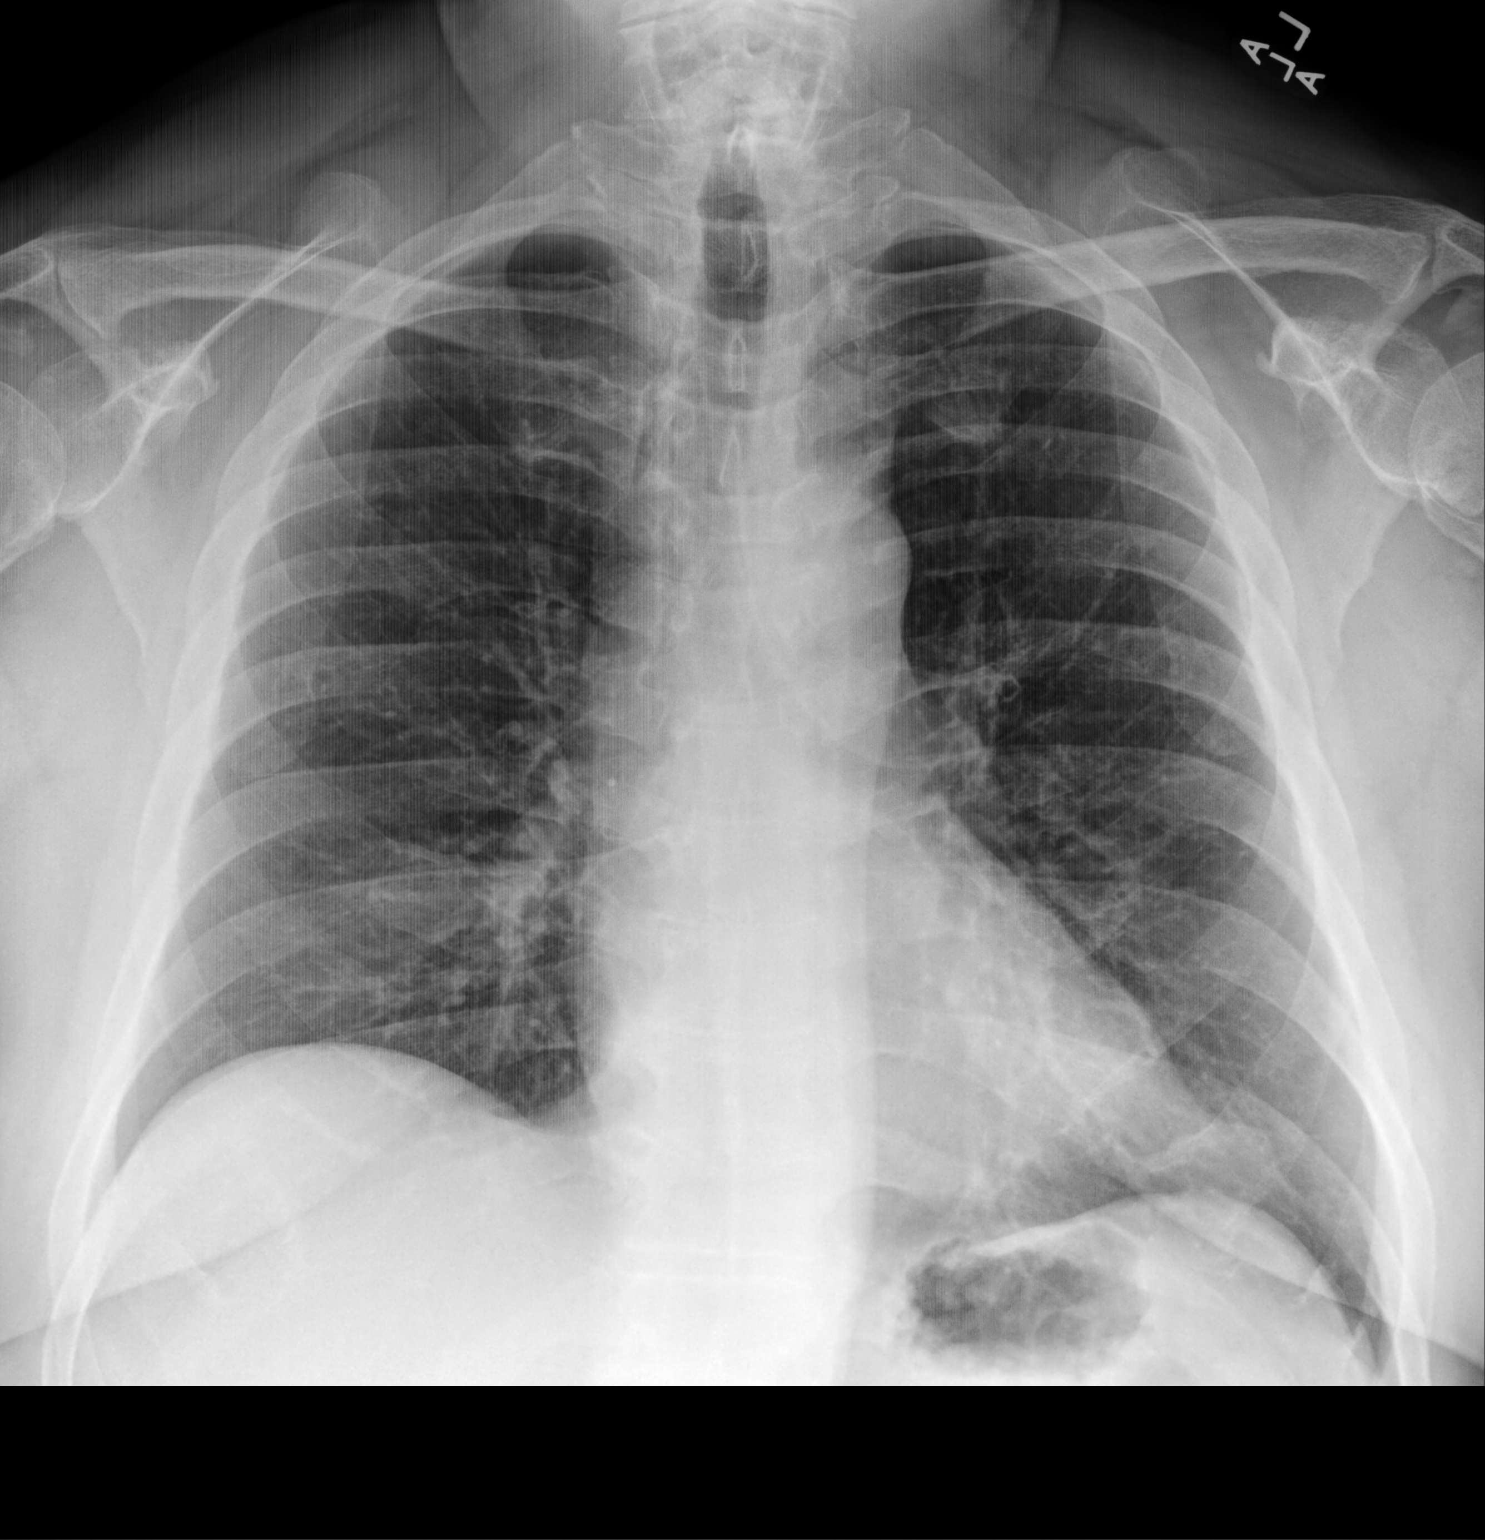

[chest lat]
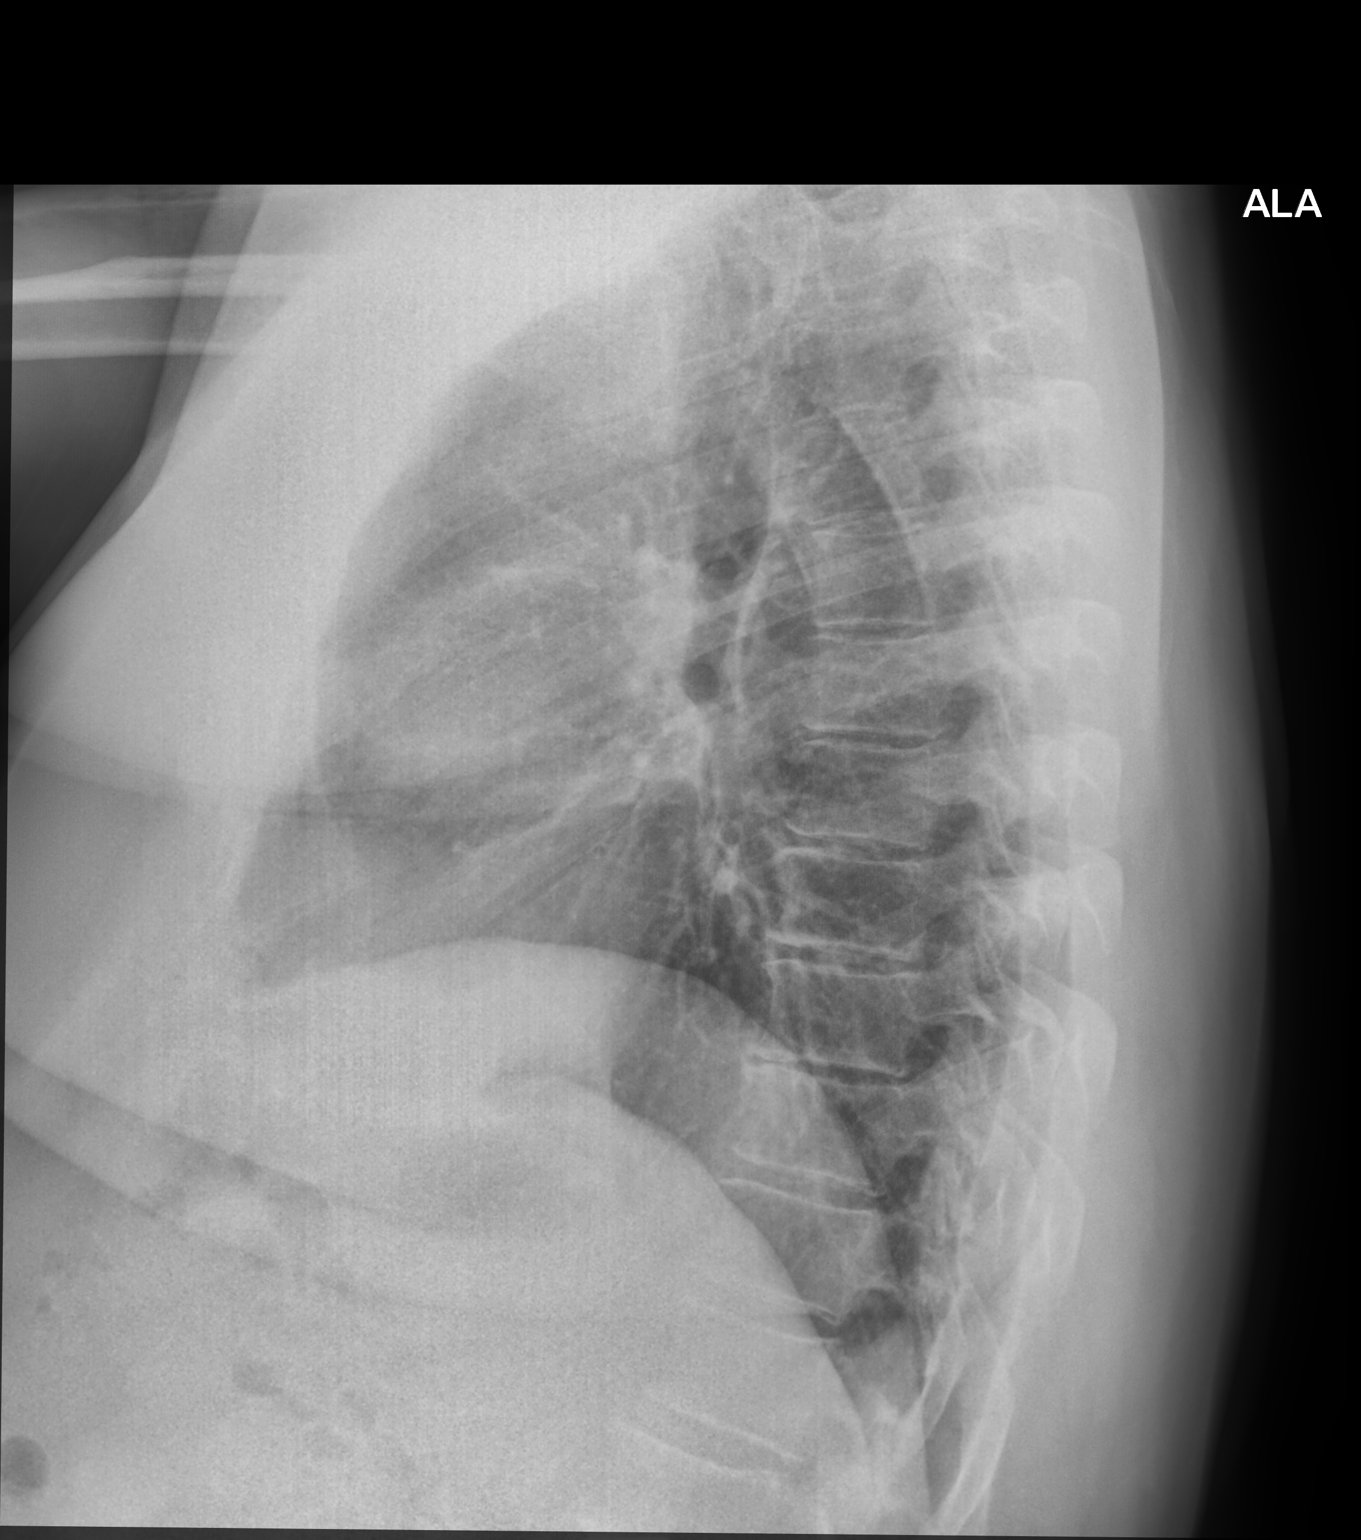

[2 of 2 positions shown; findings below may reference images not displayed]

FINDINGS: The heart size and mediastinal contours are within normal limits.
Both lungs are clear. The visualized skeletal structures are
unremarkable.
IMPRESSION: No active cardiopulmonary disease.

## 2022-11-08 ENCOUNTER — Other Ambulatory Visit: Payer: Self-pay | Admitting: Family Medicine

## 2022-11-29 ENCOUNTER — Ambulatory Visit (INDEPENDENT_AMBULATORY_CARE_PROVIDER_SITE_OTHER): Payer: 59 | Admitting: Family Medicine

## 2022-11-29 ENCOUNTER — Encounter: Payer: Self-pay | Admitting: Family Medicine

## 2022-11-29 VITALS — BP 110/78 | HR 107 | Temp 98.1°F | Ht 69.75 in | Wt 269.0 lb

## 2022-11-29 DIAGNOSIS — E538 Deficiency of other specified B group vitamins: Secondary | ICD-10-CM | POA: Diagnosis not present

## 2022-11-29 DIAGNOSIS — Z23 Encounter for immunization: Secondary | ICD-10-CM

## 2022-11-29 DIAGNOSIS — Z0000 Encounter for general adult medical examination without abnormal findings: Secondary | ICD-10-CM | POA: Diagnosis not present

## 2022-11-29 DIAGNOSIS — E1141 Type 2 diabetes mellitus with diabetic mononeuropathy: Secondary | ICD-10-CM

## 2022-11-29 DIAGNOSIS — E1129 Type 2 diabetes mellitus with other diabetic kidney complication: Secondary | ICD-10-CM | POA: Diagnosis not present

## 2022-11-29 DIAGNOSIS — E1140 Type 2 diabetes mellitus with diabetic neuropathy, unspecified: Secondary | ICD-10-CM | POA: Insufficient documentation

## 2022-11-29 LAB — HEPATIC FUNCTION PANEL
ALT: 28 U/L (ref 0–53)
AST: 20 U/L (ref 0–37)
Albumin: 4.4 g/dL (ref 3.5–5.2)
Alkaline Phosphatase: 86 U/L (ref 39–117)
Bilirubin, Direct: 0.1 mg/dL (ref 0.0–0.3)
Total Bilirubin: 0.5 mg/dL (ref 0.2–1.2)
Total Protein: 6.6 g/dL (ref 6.0–8.3)

## 2022-11-29 LAB — LIPID PANEL
Cholesterol: 178 mg/dL (ref 0–200)
HDL: 33.7 mg/dL — ABNORMAL LOW (ref >39.00)
NonHDL: 144.44
Total CHOL/HDL Ratio: 5
Triglycerides: 218 mg/dL — ABNORMAL HIGH (ref 0.0–149.0)
VLDL: 43.6 mg/dL — ABNORMAL HIGH (ref 0.0–40.0)

## 2022-11-29 LAB — CBC WITH DIFFERENTIAL/PLATELET
Basophils Absolute: 0.1 10*3/uL (ref 0.0–0.1)
Basophils Relative: 0.7 % (ref 0.0–3.0)
Eosinophils Absolute: 0.2 10*3/uL (ref 0.0–0.7)
Eosinophils Relative: 2.6 % (ref 0.0–5.0)
HCT: 42.9 % (ref 39.0–52.0)
Hemoglobin: 14.2 g/dL (ref 13.0–17.0)
Lymphocytes Relative: 25.3 % (ref 12.0–46.0)
Lymphs Abs: 1.7 10*3/uL (ref 0.7–4.0)
MCHC: 33 g/dL (ref 30.0–36.0)
MCV: 95.3 fl (ref 78.0–100.0)
Monocytes Absolute: 0.5 10*3/uL (ref 0.1–1.0)
Monocytes Relative: 7.1 % (ref 3.0–12.0)
Neutro Abs: 4.4 10*3/uL (ref 1.4–7.7)
Neutrophils Relative %: 64.3 % (ref 43.0–77.0)
Platelets: 326 10*3/uL (ref 150.0–400.0)
RBC: 4.5 Mil/uL (ref 4.22–5.81)
RDW: 14.7 % (ref 11.5–15.5)
WBC: 6.9 10*3/uL (ref 4.0–10.5)

## 2022-11-29 LAB — LDL CHOLESTEROL, DIRECT: Direct LDL: 116 mg/dL

## 2022-11-29 LAB — VITAMIN B12: Vitamin B-12: 133 pg/mL — ABNORMAL LOW (ref 211–911)

## 2022-11-29 LAB — BASIC METABOLIC PANEL
BUN: 25 mg/dL — ABNORMAL HIGH (ref 6–23)
CO2: 23 mEq/L (ref 19–32)
Calcium: 9.9 mg/dL (ref 8.4–10.5)
Chloride: 104 mEq/L (ref 96–112)
Creatinine, Ser: 1.38 mg/dL (ref 0.40–1.50)
GFR: 54.88 mL/min — ABNORMAL LOW (ref >60.00)
Glucose, Bld: 187 mg/dL — ABNORMAL HIGH (ref 70–99)
Potassium: 4.8 mEq/L (ref 3.5–5.1)
Sodium: 136 mEq/L (ref 135–145)

## 2022-11-29 LAB — HEMOGLOBIN A1C: Hgb A1c MFr Bld: 8.1 % — ABNORMAL HIGH (ref 4.6–6.5)

## 2022-11-29 LAB — PSA: PSA: 1.39 ng/mL (ref 0.10–4.00)

## 2022-11-29 LAB — TSH: TSH: 2.31 u[IU]/mL (ref 0.35–5.50)

## 2022-11-29 MED ORDER — POTASSIUM CITRATE ER 10 MEQ (1080 MG) PO TBCR: 10.0000 meq | EXTENDED_RELEASE_TABLET | Freq: Two times a day (BID) | ORAL | 3 refills | Status: AC

## 2022-11-29 MED ORDER — ATORVASTATIN CALCIUM 40 MG PO TABS: 40.0000 mg | ORAL_TABLET | Freq: Every day | ORAL | 3 refills | Status: DC

## 2022-11-29 MED ORDER — OMEPRAZOLE 40 MG PO CPDR: 40.0000 mg | DELAYED_RELEASE_CAPSULE | Freq: Every day | ORAL | 3 refills | Status: DC

## 2022-11-29 MED ORDER — ALLOPURINOL 300 MG PO TABS: 300.0000 mg | ORAL_TABLET | Freq: Every day | ORAL | 3 refills | Status: DC

## 2022-11-29 MED ORDER — PIOGLITAZONE HCL 45 MG PO TABS: 45.0000 mg | ORAL_TABLET | Freq: Every day | ORAL | 3 refills | Status: DC

## 2022-11-29 MED ORDER — GLIPIZIDE 10 MG PO TABS: ORAL_TABLET | ORAL | 3 refills | Status: DC

## 2022-11-29 MED ORDER — METFORMIN HCL 1000 MG PO TABS: 1000.0000 mg | ORAL_TABLET | Freq: Two times a day (BID) | ORAL | 3 refills | Status: DC

## 2022-11-29 MED ORDER — LISINOPRIL 20 MG PO TABS: 20.0000 mg | ORAL_TABLET | Freq: Every day | ORAL | 3 refills | Status: DC

## 2022-11-29 NOTE — Progress Notes (Signed)
Subjective:    Patient ID: Randy Waters, male    DOB: 1960-11-09, 62 y.o.   MRN: [id number]  HPI Here for a well exam. He has a few issues to discuss. First he saw Dr. Johney Frame last year to evaluate his SOB on exertion. On 06-20-22 he had a CT coronary angiogram which showed a calcium score of 261, as well as minimal CAD (1-29%). On 06-23-22 he had an ECHO showing an EF of 60-65% and this was totally normal. He still has some SOB but he realizes this is due to his obesity and poor conditioning. He is interested in trying a GLP-1 agonist to help lose weight. He does not check his glucoses at home. His A1c last year was 8.6%. He also admits to feeling some mild numbness, tingling, and burning in both feet which started a few months ago.    Review of Systems  Constitutional: Negative.   HENT: Negative.    Eyes: Negative.   Respiratory:  Positive for shortness of breath.   Cardiovascular: Negative.   Gastrointestinal: Negative.   Genitourinary: Negative.   Musculoskeletal: Negative.   Skin: Negative.   Neurological:  Positive for numbness.  Psychiatric/Behavioral: Negative.         Objective:   Physical Exam Constitutional:      General: He is not in acute distress.    Appearance: He is well-developed. He is obese. He is not diaphoretic.  HENT:     Head: Normocephalic and atraumatic.     Right Ear: External ear normal.     Left Ear: External ear normal.     Nose: Nose normal.     Mouth/Throat:     Pharynx: No oropharyngeal exudate.  Eyes:     General: No scleral icterus.       Right eye: No discharge.        Left eye: No discharge.     Conjunctiva/sclera: Conjunctivae normal.     Pupils: Pupils are equal, round, and reactive to light.  Neck:     Thyroid: No thyromegaly.     Vascular: No JVD.     Trachea: No tracheal deviation.  Cardiovascular:     Rate and Rhythm: Normal rate and regular rhythm.     Pulses: Normal pulses.     Heart sounds: Normal heart sounds. No  murmur heard.    No friction rub. No gallop.  Pulmonary:     Effort: Pulmonary effort is normal. No respiratory distress.     Breath sounds: Normal breath sounds. No wheezing or rales.  Chest:     Chest wall: No tenderness.  Abdominal:     General: Bowel sounds are normal. There is no distension.     Palpations: Abdomen is soft. There is no mass.     Tenderness: There is no abdominal tenderness. There is no guarding or rebound.  Genitourinary:    Penis: Normal. No tenderness.      Testes: Normal.     Prostate: Normal.     Rectum: Normal. Guaiac result negative.  Musculoskeletal:        General: No tenderness. Normal range of motion.     Cervical back: Neck supple.  Lymphadenopathy:     Cervical: No cervical adenopathy.  Skin:    General: Skin is warm and dry.     Coloration: Skin is not pale.     Findings: No erythema or rash.  Neurological:     Mental Status: He is alert and oriented to person,  place, and time.     Cranial Nerves: No cranial nerve deficit.     Motor: No abnormal muscle tone.     Coordination: Coordination normal.     Deep Tendon Reflexes: Reflexes are normal and symmetric. Reflexes normal.  Psychiatric:        Mood and Affect: Mood normal.        Behavior: Behavior normal.        Thought Content: Thought content normal.        Judgment: Judgment normal.           Assessment & Plan:  Well exam. We discussed diet and exercise. Get fasting labs including an A1c. He plans to have a diabetic eye exam this June. He has begun to have some neuropathy in his feet. We spoke about the need for him to lose weight, and he is very interested in trying a GLP-1 agent. He will talk to his insurance company about which agent they will cover.  Alysia Penna, MD

## 2022-11-30 ENCOUNTER — Telehealth: Payer: Self-pay | Admitting: Family Medicine

## 2022-11-30 NOTE — Telephone Encounter (Signed)
Spoke with patient concerning referral to Endocrinology.

## 2022-11-30 NOTE — Telephone Encounter (Signed)
Pt call and stated he want a call back to tell him what is the referral for and want a call back.

## 2022-11-30 NOTE — Addendum Note (Signed)
Addended by: Alysia Penna A on: 11/30/2022 12:52 PM   Modules accepted: Orders

## 2022-12-01 ENCOUNTER — Telehealth: Payer: Self-pay | Admitting: Family Medicine

## 2022-12-01 NOTE — Telephone Encounter (Signed)
Pt call and stated his Insurance co stated that they will cover Trulicity or victoza  pt also stated it will need a Pa.pt stated you can call him back if you need a number.

## 2022-12-01 NOTE — Telephone Encounter (Signed)
Spoke with pt scheduled appointment for his 1st B12 Injection on 12/04/22 at 10.45 am. Pt also states that his insurance covers Victoza and Trulicity, Pt states that he has appointment coming up with the  Endocrinology and will see if they can start managing this

## 2022-12-01 NOTE — Telephone Encounter (Addendum)
See 11/29/22 office visit note.    Spoke with patient on 11/30/22 concerning lab results, patient has concerns about low Vit B12 level, he asked should he take a supplement?   Please advise

## 2022-12-01 NOTE — Telephone Encounter (Signed)
As for the low B12, yes I meant to treat this because it may be contributing to the neuropathy. Set him up for weekly B12 shots for 12 weeks, then we will check another level. As for the diabetes, I have referred him to Endocrinology so they will decide about his medications

## 2022-12-04 ENCOUNTER — Ambulatory Visit (INDEPENDENT_AMBULATORY_CARE_PROVIDER_SITE_OTHER): Payer: 59

## 2022-12-04 DIAGNOSIS — E538 Deficiency of other specified B group vitamins: Secondary | ICD-10-CM | POA: Diagnosis not present

## 2022-12-04 MED ORDER — CYANOCOBALAMIN 1000 MCG/ML IJ SOLN
1000.0000 ug | Freq: Once | INTRAMUSCULAR | Status: AC
  Administered 2022-12-04: 1000 ug via INTRAMUSCULAR

## 2022-12-04 NOTE — Progress Notes (Signed)
Per orders of Dr. Fry, injection of Cyanocobalamin 1000 mcg given by [place] Gan L Zaylan Kissoon. °Patient tolerated injection well.  °

## 2022-12-12 ENCOUNTER — Ambulatory Visit (INDEPENDENT_AMBULATORY_CARE_PROVIDER_SITE_OTHER): Payer: 59 | Admitting: *Deleted

## 2022-12-12 DIAGNOSIS — E538 Deficiency of other specified B group vitamins: Secondary | ICD-10-CM

## 2022-12-12 MED ORDER — CYANOCOBALAMIN 1000 MCG/ML IJ SOLN
1000.0000 ug | Freq: Once | INTRAMUSCULAR | Status: AC
  Administered 2022-12-12: 1000 ug via INTRAMUSCULAR

## 2022-12-12 NOTE — Progress Notes (Signed)
Per orders of Dr. Fry, injection of B12 given by Tuvia Woodrick. Patient tolerated injection well. 

## 2022-12-13 DIAGNOSIS — I2510 Atherosclerotic heart disease of native coronary artery without angina pectoris: Secondary | ICD-10-CM | POA: Diagnosis not present

## 2022-12-13 DIAGNOSIS — E785 Hyperlipidemia, unspecified: Secondary | ICD-10-CM | POA: Diagnosis not present

## 2022-12-13 DIAGNOSIS — E1165 Type 2 diabetes mellitus with hyperglycemia: Secondary | ICD-10-CM | POA: Diagnosis not present

## 2022-12-13 DIAGNOSIS — I10 Essential (primary) hypertension: Secondary | ICD-10-CM | POA: Diagnosis not present

## 2022-12-13 DIAGNOSIS — N1831 Chronic kidney disease, stage 3a: Secondary | ICD-10-CM | POA: Diagnosis not present

## 2022-12-19 ENCOUNTER — Ambulatory Visit (INDEPENDENT_AMBULATORY_CARE_PROVIDER_SITE_OTHER): Payer: 59

## 2022-12-19 DIAGNOSIS — E538 Deficiency of other specified B group vitamins: Secondary | ICD-10-CM | POA: Diagnosis not present

## 2022-12-19 MED ORDER — CYANOCOBALAMIN 1000 MCG/ML IJ SOLN
1000.0000 ug | Freq: Once | INTRAMUSCULAR | Status: AC
  Administered 2022-12-19: 1000 ug via INTRAMUSCULAR

## 2022-12-19 NOTE — Progress Notes (Signed)
Pt here for monthly B12 injection per Dr Clent Ridges  B12 given IM and pt tolerated injection well.  Next B12 injection scheduled for 12/26/22

## 2022-12-19 NOTE — Progress Notes (Deleted)
B 12

## 2022-12-26 ENCOUNTER — Ambulatory Visit (INDEPENDENT_AMBULATORY_CARE_PROVIDER_SITE_OTHER): Payer: 59

## 2022-12-26 DIAGNOSIS — E538 Deficiency of other specified B group vitamins: Secondary | ICD-10-CM

## 2022-12-26 MED ORDER — CYANOCOBALAMIN 1000 MCG/ML IJ SOLN: 1000.0000 ug | Freq: Once | INTRAMUSCULAR | 0 refills | Status: DC

## 2022-12-26 MED ORDER — CYANOCOBALAMIN 1000 MCG/ML IJ SOLN
1000.0000 ug | Freq: Once | INTRAMUSCULAR | Status: AC
  Administered 2022-12-26: 1000 ug via INTRAMUSCULAR

## 2022-12-26 NOTE — Progress Notes (Signed)
Pt here for monthly B12 injection per Dr Clent Ridges.  B12 given IM and pt tolerated injection well.  Next B12 injection scheduled for next week.

## 2023-01-02 ENCOUNTER — Ambulatory Visit (INDEPENDENT_AMBULATORY_CARE_PROVIDER_SITE_OTHER): Payer: 59 | Admitting: *Deleted

## 2023-01-02 DIAGNOSIS — E538 Deficiency of other specified B group vitamins: Secondary | ICD-10-CM

## 2023-01-02 MED ORDER — CYANOCOBALAMIN 1000 MCG/ML IJ SOLN
1000.0000 ug | Freq: Once | INTRAMUSCULAR | Status: AC
Start: 2023-01-02 — End: 2023-01-02
  Administered 2023-01-02: 1000 ug via INTRAMUSCULAR

## 2023-01-02 NOTE — Progress Notes (Signed)
Per orders of Dr. Fry, injection of Cyanocobalamin 1000mcg given by Tarvis Blossom A. Patient tolerated injection well.  

## 2023-01-09 ENCOUNTER — Ambulatory Visit (INDEPENDENT_AMBULATORY_CARE_PROVIDER_SITE_OTHER): Payer: 59

## 2023-01-09 DIAGNOSIS — E538 Deficiency of other specified B group vitamins: Secondary | ICD-10-CM | POA: Diagnosis not present

## 2023-01-09 MED ORDER — CYANOCOBALAMIN 1000 MCG/ML IJ SOLN
1000.0000 ug | Freq: Once | INTRAMUSCULAR | Status: AC
Start: 2023-01-09 — End: 2023-01-09
  Administered 2023-01-09: 1000 ug via INTRAMUSCULAR

## 2023-01-09 NOTE — Progress Notes (Signed)
Pt here for monthly B12 injection per Dr Clent Ridges  B12 given IM and pt tolerated injection well.  Next B12 injection scheduled for pt will call  to schedule

## 2023-01-30 DIAGNOSIS — E785 Hyperlipidemia, unspecified: Secondary | ICD-10-CM | POA: Diagnosis not present

## 2023-01-30 DIAGNOSIS — N1831 Chronic kidney disease, stage 3a: Secondary | ICD-10-CM | POA: Diagnosis not present

## 2023-01-30 DIAGNOSIS — E1165 Type 2 diabetes mellitus with hyperglycemia: Secondary | ICD-10-CM | POA: Diagnosis not present

## 2023-01-30 DIAGNOSIS — I10 Essential (primary) hypertension: Secondary | ICD-10-CM | POA: Diagnosis not present

## 2023-01-30 DIAGNOSIS — I2510 Atherosclerotic heart disease of native coronary artery without angina pectoris: Secondary | ICD-10-CM | POA: Diagnosis not present

## 2023-02-16 ENCOUNTER — Other Ambulatory Visit: Payer: Self-pay | Admitting: Family Medicine

## 2023-02-16 NOTE — Telephone Encounter (Signed)
Isac Caddy tech is calling and pt needs one touch verio test strip with frequency  [facility] [id number] - [place], [place] - [address] Phone: [phone]  Fax: [phone]

## 2023-02-21 NOTE — Telephone Encounter (Signed)
Pharmacy called to say the Rx sent is incorrect.   Rx should be for the One Touch Verio, and not the ULTRA.  Pt is at the Pharmacy right now (12:08pm) and says it takes a lot for him to get there.   They are asking that corrected Rx be faxed ASAP.  [facility] [id number] - [place], [place] - [address] Phone: [phone]  Fax: [phone]

## 2023-02-23 ENCOUNTER — Other Ambulatory Visit: Payer: Self-pay

## 2023-02-23 MED ORDER — ONETOUCH VERIO REFLECT W/DEVICE KIT: 1.0000 | PACK | 0 refills | Status: AC

## 2023-03-15 DIAGNOSIS — E1165 Type 2 diabetes mellitus with hyperglycemia: Secondary | ICD-10-CM | POA: Diagnosis not present

## 2023-03-21 DIAGNOSIS — N1831 Chronic kidney disease, stage 3a: Secondary | ICD-10-CM | POA: Diagnosis not present

## 2023-03-21 DIAGNOSIS — E785 Hyperlipidemia, unspecified: Secondary | ICD-10-CM | POA: Diagnosis not present

## 2023-03-21 DIAGNOSIS — E1165 Type 2 diabetes mellitus with hyperglycemia: Secondary | ICD-10-CM | POA: Diagnosis not present

## 2023-03-21 DIAGNOSIS — I10 Essential (primary) hypertension: Secondary | ICD-10-CM | POA: Diagnosis not present

## 2023-03-21 DIAGNOSIS — I2510 Atherosclerotic heart disease of native coronary artery without angina pectoris: Secondary | ICD-10-CM | POA: Diagnosis not present

## 2023-06-22 ENCOUNTER — Telehealth (HOSPITAL_COMMUNITY): Payer: Self-pay | Admitting: Cardiology

## 2023-06-22 NOTE — Telephone Encounter (Signed)
Patient called and cancelled echocardiogram scheduled for 06/26/23 and does not wish to reschedule. Order wil be removed from the echo WQ. Thank you.

## 2023-06-26 ENCOUNTER — Other Ambulatory Visit (HOSPITAL_COMMUNITY): Payer: Managed Care, Other (non HMO)

## 2023-07-25 DIAGNOSIS — E1165 Type 2 diabetes mellitus with hyperglycemia: Secondary | ICD-10-CM | POA: Diagnosis not present

## 2023-08-01 DIAGNOSIS — I10 Essential (primary) hypertension: Secondary | ICD-10-CM | POA: Diagnosis not present

## 2023-08-01 DIAGNOSIS — E785 Hyperlipidemia, unspecified: Secondary | ICD-10-CM | POA: Diagnosis not present

## 2023-08-01 DIAGNOSIS — E1165 Type 2 diabetes mellitus with hyperglycemia: Secondary | ICD-10-CM | POA: Diagnosis not present

## 2023-08-13 ENCOUNTER — Encounter: Payer: Self-pay | Admitting: Family Medicine

## 2023-08-13 NOTE — Telephone Encounter (Signed)
 Care team updated and letter sent for eye exam notes.

## 2023-12-03 ENCOUNTER — Other Ambulatory Visit: Payer: Self-pay | Admitting: Family Medicine

## 2023-12-17 ENCOUNTER — Other Ambulatory Visit: Payer: Self-pay | Admitting: Family Medicine

## 2024-01-22 DIAGNOSIS — E1165 Type 2 diabetes mellitus with hyperglycemia: Secondary | ICD-10-CM | POA: Diagnosis not present

## 2024-01-22 DIAGNOSIS — E785 Hyperlipidemia, unspecified: Secondary | ICD-10-CM | POA: Diagnosis not present

## 2024-01-29 DIAGNOSIS — N1831 Chronic kidney disease, stage 3a: Secondary | ICD-10-CM | POA: Diagnosis not present

## 2024-01-29 DIAGNOSIS — I2510 Atherosclerotic heart disease of native coronary artery without angina pectoris: Secondary | ICD-10-CM | POA: Diagnosis not present

## 2024-01-29 DIAGNOSIS — E1165 Type 2 diabetes mellitus with hyperglycemia: Secondary | ICD-10-CM | POA: Diagnosis not present

## 2024-01-29 DIAGNOSIS — E785 Hyperlipidemia, unspecified: Secondary | ICD-10-CM | POA: Diagnosis not present

## 2024-01-29 DIAGNOSIS — I10 Essential (primary) hypertension: Secondary | ICD-10-CM | POA: Diagnosis not present

## 2024-02-18 ENCOUNTER — Other Ambulatory Visit: Payer: Self-pay | Admitting: Family Medicine

## 2024-02-26 NOTE — Progress Notes (Signed)
 [facility] Quality Team Note  Name: Randy Waters Date of Birth: October 14, 1960 MRN: [id number] Date: 02/26/2024  [facility] Quality Team has reviewed this patient's chart, please see recommendations below:  [facility] Quality Other; (CHART REVIEWED, ABSTRACTED 2024 NEGATIVE DIABETIC EYE EXAM)

## 2024-04-13 ENCOUNTER — Other Ambulatory Visit: Payer: Self-pay | Admitting: Family Medicine

## 2024-04-15 LAB — HM DIABETES EYE EXAM

## 2024-04-17 DIAGNOSIS — M79644 Pain in right finger(s): Secondary | ICD-10-CM | POA: Diagnosis not present

## 2024-04-17 DIAGNOSIS — S61210A Laceration without foreign body of right index finger without damage to nail, initial encounter: Secondary | ICD-10-CM | POA: Diagnosis not present

## 2024-05-07 ENCOUNTER — Ambulatory Visit (INDEPENDENT_AMBULATORY_CARE_PROVIDER_SITE_OTHER): Admitting: Family Medicine

## 2024-05-07 ENCOUNTER — Encounter: Payer: Self-pay | Admitting: Family Medicine

## 2024-05-07 VITALS — BP 118/70 | HR 80 | Temp 98.1°F | Ht 69.5 in | Wt 192.6 lb

## 2024-05-07 DIAGNOSIS — Z0000 Encounter for general adult medical examination without abnormal findings: Secondary | ICD-10-CM | POA: Diagnosis not present

## 2024-05-07 LAB — HEPATIC FUNCTION PANEL
ALT: 31 U/L (ref 0–53)
AST: 22 U/L (ref 0–37)
Albumin: 4.6 g/dL (ref 3.5–5.2)
Alkaline Phosphatase: 79 U/L (ref 39–117)
Bilirubin, Direct: 0.2 mg/dL (ref 0.0–0.3)
Total Bilirubin: 0.7 mg/dL (ref 0.2–1.2)
Total Protein: 6.7 g/dL (ref 6.0–8.3)

## 2024-05-07 LAB — CBC WITH DIFFERENTIAL/PLATELET
Basophils Absolute: 0 K/uL (ref 0.0–0.1)
Basophils Relative: 0.5 % (ref 0.0–3.0)
Eosinophils Absolute: 0.2 K/uL (ref 0.0–0.7)
Eosinophils Relative: 2.6 % (ref 0.0–5.0)
HCT: 44.8 % (ref 39.0–52.0)
Hemoglobin: 14.7 g/dL (ref 13.0–17.0)
Lymphocytes Relative: 31.5 % (ref 12.0–46.0)
Lymphs Abs: 2 K/uL (ref 0.7–4.0)
MCHC: 32.7 g/dL (ref 30.0–36.0)
MCV: 94.4 fl (ref 78.0–100.0)
Monocytes Absolute: 0.4 K/uL (ref 0.1–1.0)
Monocytes Relative: 6.9 % (ref 3.0–12.0)
Neutro Abs: 3.8 K/uL (ref 1.4–7.7)
Neutrophils Relative %: 58.5 % (ref 43.0–77.0)
Platelets: 229 K/uL (ref 150.0–400.0)
RBC: 4.75 Mil/uL (ref 4.22–5.81)
RDW: 14.5 % (ref 11.5–15.5)
WBC: 6.5 K/uL (ref 4.0–10.5)

## 2024-05-07 LAB — BASIC METABOLIC PANEL WITH GFR
BUN: 15 mg/dL (ref 6–23)
CO2: 28 meq/L (ref 19–32)
Calcium: 9.9 mg/dL (ref 8.4–10.5)
Chloride: 104 meq/L (ref 96–112)
Creatinine, Ser: 1.06 mg/dL (ref 0.40–1.50)
GFR: 74.56 mL/min (ref >60.00)
Glucose, Bld: 90 mg/dL (ref 70–99)
Potassium: 4.1 meq/L (ref 3.5–5.1)
Sodium: 140 meq/L (ref 135–145)

## 2024-05-07 LAB — LIPID PANEL
Cholesterol: 92 mg/dL (ref 0–200)
HDL: 34.9 mg/dL — ABNORMAL LOW (ref >39.00)
LDL Cholesterol: 49 mg/dL (ref 0–99)
NonHDL: 56.68
Total CHOL/HDL Ratio: 3
Triglycerides: 40 mg/dL (ref 0.0–149.0)
VLDL: 8 mg/dL (ref 0.0–40.0)

## 2024-05-07 LAB — TSH: TSH: 1.97 u[IU]/mL (ref 0.35–5.50)

## 2024-05-07 LAB — PSA: PSA: 1.82 ng/mL (ref 0.10–4.00)

## 2024-05-07 LAB — HEMOGLOBIN A1C: Hgb A1c MFr Bld: 5.9 % (ref 4.6–6.5)

## 2024-05-07 NOTE — Progress Notes (Signed)
 Subjective:    Patient ID: Randy Waters, male    DOB: 27-Sep-1960, 63 y.o.   MRN: [id number]  HPI Here for a well exam. He feels great. He sees Harlene Bill NP at [facility] for his daibetes, and his A1c I June was down to 5.8%. he has lost about 35 lbs in the past year. He gets yearly eye exams.    Review of Systems  Constitutional: Negative.   HENT: Negative.    Eyes: Negative.   Respiratory: Negative.    Cardiovascular: Negative.   Gastrointestinal: Negative.   Genitourinary: Negative.   Musculoskeletal: Negative.   Skin: Negative.   Neurological: Negative.   Psychiatric/Behavioral: Negative.         Objective:   Physical Exam Constitutional:      General: He is not in acute distress.    Appearance: Normal appearance. He is well-developed. He is not diaphoretic.  HENT:     Head: Normocephalic and atraumatic.     Right Ear: External ear normal.     Left Ear: External ear normal.     Nose: Nose normal.     Mouth/Throat:     Pharynx: No oropharyngeal exudate.  Eyes:     General: No scleral icterus.       Right eye: No discharge.        Left eye: No discharge.     Conjunctiva/sclera: Conjunctivae normal.     Pupils: Pupils are equal, round, and reactive to light.  Neck:     Thyroid : No thyromegaly.     Vascular: No JVD.     Trachea: No tracheal deviation.  Cardiovascular:     Rate and Rhythm: Normal rate and regular rhythm.     Pulses: Normal pulses.     Heart sounds: Normal heart sounds. No murmur heard.    No friction rub. No gallop.  Pulmonary:     Effort: Pulmonary effort is normal. No respiratory distress.     Breath sounds: Normal breath sounds. No wheezing or rales.  Chest:     Chest wall: No tenderness.  Abdominal:     General: Bowel sounds are normal. There is no distension.     Palpations: Abdomen is soft. There is no mass.     Tenderness: There is no abdominal tenderness. There is no guarding or rebound.  Genitourinary:     Penis: Normal. No tenderness.      Testes: Normal.     Prostate: Normal.     Rectum: Normal. Guaiac result negative.  Musculoskeletal:        General: No tenderness. Normal range of motion.     Cervical back: Neck supple.  Lymphadenopathy:     Cervical: No cervical adenopathy.  Skin:    General: Skin is warm and dry.     Coloration: Skin is not pale.     Findings: No erythema or rash.  Neurological:     General: No focal deficit present.     Mental Status: He is alert and oriented to person, place, and time.     Cranial Nerves: No cranial nerve deficit.     Motor: No abnormal muscle tone.     Coordination: Coordination normal.     Deep Tendon Reflexes: Reflexes are normal and symmetric. Reflexes normal.  Psychiatric:        Mood and Affect: Mood normal.        Behavior: Behavior normal.        Thought Content: Thought content normal.  Judgment: Judgment normal.           Assessment & Plan:  Well exam. We discussed diet and exercise. Get fasting labs. Set up another colonoscopy.  Garnette Olmsted, MD

## 2024-05-08 ENCOUNTER — Ambulatory Visit: Payer: Self-pay | Admitting: Family Medicine

## 2024-07-11 DIAGNOSIS — E785 Hyperlipidemia, unspecified: Secondary | ICD-10-CM | POA: Diagnosis not present

## 2024-07-11 DIAGNOSIS — E1165 Type 2 diabetes mellitus with hyperglycemia: Secondary | ICD-10-CM | POA: Diagnosis not present

## 2024-08-04 DIAGNOSIS — E1165 Type 2 diabetes mellitus with hyperglycemia: Secondary | ICD-10-CM | POA: Diagnosis not present

## 2024-08-04 DIAGNOSIS — I10 Essential (primary) hypertension: Secondary | ICD-10-CM | POA: Diagnosis not present

## 2024-08-04 DIAGNOSIS — N1831 Chronic kidney disease, stage 3a: Secondary | ICD-10-CM | POA: Diagnosis not present

## 2024-08-04 DIAGNOSIS — E785 Hyperlipidemia, unspecified: Secondary | ICD-10-CM | POA: Diagnosis not present

## 2024-08-06 ENCOUNTER — Telehealth: Payer: Self-pay

## 2024-08-06 ENCOUNTER — Encounter: Payer: Self-pay | Admitting: Internal Medicine

## 2024-08-06 NOTE — Telephone Encounter (Signed)
 Attempted to reach patient concerning colonoscopy recall; unable to speak with patient;  left message and number to the office for patient to call back and schedule appts;

## 2024-08-08 ENCOUNTER — Ambulatory Visit (AMBULATORY_SURGERY_CENTER)

## 2024-08-08 VITALS — Ht 69.5 in | Wt 200.0 lb

## 2024-08-08 DIAGNOSIS — Z860100 Personal history of colon polyps, unspecified: Secondary | ICD-10-CM

## 2024-08-08 MED ORDER — PLENVU 140 G PO SOLR: 1.0000 | Freq: Once | ORAL | 0 refills | Status: AC

## 2024-08-08 NOTE — Progress Notes (Signed)
 No issues known to pt with past sedation with any surgeries or procedures Patient denies ever being told they had issues or difficulty with intubation  No FH of Malignant Hyperthermia Pt is not on diet pills; is taking GLP-1 medication Pt is not on home 02  Pt is not on blood thinners  Pt states occasional issues with chronic constipation; takes Dulcolax No A fib or A flutter Have any cardiac testing pending--no Pt instructed to use [email or web address] or GoodRx for a price reduction on prep  Ambulates independently

## 2024-08-11 ENCOUNTER — Encounter: Payer: Self-pay | Admitting: Internal Medicine

## 2024-08-13 ENCOUNTER — Encounter: Payer: Self-pay | Admitting: Internal Medicine
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
## 2024-08-19 ENCOUNTER — Ambulatory Visit: Admitting: Internal Medicine

## 2024-08-19 ENCOUNTER — Encounter: Payer: Self-pay | Admitting: Internal Medicine

## 2024-08-19 VITALS — BP 106/67 | HR 85 | Temp 97.7°F | Resp 12 | Ht 69.5 in | Wt 200.0 lb

## 2024-08-19 DIAGNOSIS — Z800 Family history of malignant neoplasm of digestive organs: Secondary | ICD-10-CM | POA: Diagnosis not present

## 2024-08-19 DIAGNOSIS — D123 Benign neoplasm of transverse colon: Secondary | ICD-10-CM

## 2024-08-19 DIAGNOSIS — K562 Volvulus: Secondary | ICD-10-CM | POA: Diagnosis not present

## 2024-08-19 DIAGNOSIS — Z860101 Personal history of adenomatous and serrated colon polyps: Secondary | ICD-10-CM

## 2024-08-19 DIAGNOSIS — K648 Other hemorrhoids: Secondary | ICD-10-CM

## 2024-08-19 DIAGNOSIS — Z1211 Encounter for screening for malignant neoplasm of colon: Secondary | ICD-10-CM | POA: Diagnosis present

## 2024-08-19 DIAGNOSIS — Z860100 Personal history of colon polyps, unspecified: Secondary | ICD-10-CM

## 2024-08-19 DIAGNOSIS — K5730 Diverticulosis of large intestine without perforation or abscess without bleeding: Secondary | ICD-10-CM | POA: Diagnosis not present

## 2024-08-19 MED ORDER — SODIUM CHLORIDE 0.9 % IV SOLN: 500.0000 mL | Freq: Once | INTRAVENOUS | Status: DC

## 2024-08-19 NOTE — Progress Notes (Signed)
 Report given to PACU, vss

## 2024-08-19 NOTE — Progress Notes (Signed)
 Pt's states no medical or surgical changes since previsit or office visit.

## 2024-08-19 NOTE — Progress Notes (Signed)
 Called to room to assist during endoscopic procedure.  Patient ID and intended procedure confirmed with present staff. Received instructions for my participation in the procedure from the performing physician.

## 2024-08-19 NOTE — Op Note (Signed)
  [facility] Patient Name: Randy Waters Procedure Date: 08/19/2024 8:05 AM MRN: [id number] Endoscopist: Lupita FORBES Commander , MD, [id number] Age: 63 Referring MD:  Date of Birth: 1961-03-04 Gender: Male Account #: [id number] Procedure:                Colonoscopy Indications:              High risk colon cancer surveillance: Personal                            history of colonic polyps, Last colonoscopy: March                            2022 Medicines:                Monitored Anesthesia Care Procedure:                Pre-Anesthesia Assessment:                           - Prior to the procedure, a History and Physical                            was performed, and patient medications and                            allergies were reviewed. The patient's tolerance of                            previous anesthesia was also reviewed. The risks                            and benefits of the procedure and the sedation                            options and risks were discussed with the patient.                            All questions were answered, and informed consent                            was obtained. Prior Anticoagulants: The patient has                            taken no anticoagulant or antiplatelet agents. ASA                            Grade Assessment: II - A patient with mild systemic                            disease. After reviewing the risks and benefits,                            the patient was deemed in satisfactory condition to  undergo the procedure.                           After obtaining informed consent, the colonoscope                            was passed under direct vision. Throughout the                            procedure, the patient's blood pressure, pulse, and                            oxygen saturations were monitored continuously. The                            Olympus Scope [id number] was introduced through the                             anus and advanced to the the cecum, identified by                            appendiceal orifice and ileocecal valve. The                            colonoscopy was somewhat difficult due to                            significant looping. Successful completion of the                            procedure was aided by applying abdominal pressure.                            The patient tolerated the procedure well. The                            quality of the bowel preparation was adequate. The                            bowel preparation used was SUPREP via split dose                            instruction. The ileocecal valve, appendiceal                            orifice, and rectum were photographed. Scope In: 8:17:53 AM Scope Out: 8:33:11 AM Scope Withdrawal Time: 0 hours 9 minutes 8 seconds  Total Procedure Duration: 0 hours 15 minutes 18 seconds  Findings:                 The perianal and digital rectal examinations were                            normal. Pertinent negatives include normal prostate                            (  size, shape, and consistency).                           An 8 mm polyp was found in the transverse colon.                            The polyp was sessile. The polyp was removed with a                            cold snare. Resection and retrieval were complete.                            Verification of patient identification for the                            specimen was done. Estimated blood loss was minimal.                           Multiple diverticula were found in the entire colon.                           Internal hemorrhoids were found during retroflexion.                           The exam was otherwise without abnormality on                            direct and retroflexion views. Complications:            No immediate complications. Estimated Blood Loss:     Estimated blood loss was minimal. Impression:               - One  8 mm polyp in the transverse colon, removed                            with a cold snare. Resected and retrieved.                           - Diverticulosis in the entire examined colon.                           - Internal hemorrhoids.                           - The examination was otherwise normal on direct                            and retroflexion views.                           - Personal history of colonic polyps. Family                            history of colon cancer in father (42's) and a  cosuin also.                           March 2022- 8 adenomas < 1 cm                           Two tubular adenomas on a colonoscopy in 2009 Recommendation:           - Patient has a contact number available for                            emergencies. The signs and symptoms of potential                            delayed complications were discussed with the                            patient. Return to normal activities tomorrow.                            Written discharge instructions were provided to the                            patient.                           - Resume previous diet.                           - Continue present medications.                           - Await pathology results.                           - Repeat colonoscopy is recommended for                            surveillance. The colonoscopy date will be                            determined after pathology results from today's                            exam become available for review. Lupita FORBES Commander, MD 08/19/2024 8:45:58 AM This report has been signed electronically.

## 2024-08-19 NOTE — Patient Instructions (Signed)

## 2024-08-20 ENCOUNTER — Telehealth: Payer: Self-pay

## 2024-08-20 NOTE — Telephone Encounter (Signed)
" °  Follow up Call-     08/19/2024    7:30 AM  Call back number  Post procedure Call Back phone  # [phone]  Permission to leave phone message Yes     Patient questions:  Do you have a fever, pain , or abdominal swelling? No. Pain Score  0 *  Have you tolerated food without any problems? Yes.    Have you been able to return to your normal activities? Yes.    Do you have any questions about your discharge instructions: Diet   No. Medications  No. Follow up visit  No.  Do you have questions or concerns about your Care? No  Actions: * If pain score is 4 or above: No action needed, pain <4.   "

## 2024-08-26 LAB — SURGICAL PATHOLOGY

## 2024-08-28 ENCOUNTER — Ambulatory Visit: Payer: Self-pay | Admitting: Internal Medicine

## 2024-08-28 DIAGNOSIS — Z860101 Personal history of adenomatous and serrated colon polyps: Secondary | ICD-10-CM | POA: Insufficient documentation
